# Patient Record
Sex: Female | Born: 1955 | Race: White | Hispanic: No | Marital: Single | Smoking: Never smoker
Health system: Southern US, Community
[De-identification: ages and names within clinical notes are randomized; demographics above are authoritative.]

## PROBLEM LIST (undated history)

## (undated) DIAGNOSIS — G2581 Restless legs syndrome: Secondary | ICD-10-CM

## (undated) DIAGNOSIS — IMO0001 Reserved for inherently not codable concepts without codable children: Secondary | ICD-10-CM

## (undated) DIAGNOSIS — F32A Depression, unspecified: Secondary | ICD-10-CM

## (undated) DIAGNOSIS — E669 Obesity, unspecified: Secondary | ICD-10-CM

## (undated) DIAGNOSIS — R251 Tremor, unspecified: Secondary | ICD-10-CM

## (undated) DIAGNOSIS — F419 Anxiety disorder, unspecified: Secondary | ICD-10-CM

## (undated) DIAGNOSIS — E7800 Pure hypercholesterolemia, unspecified: Secondary | ICD-10-CM

## (undated) DIAGNOSIS — F329 Major depressive disorder, single episode, unspecified: Secondary | ICD-10-CM

## (undated) DIAGNOSIS — D649 Anemia, unspecified: Secondary | ICD-10-CM

## (undated) DIAGNOSIS — M797 Fibromyalgia: Secondary | ICD-10-CM

## (undated) HISTORY — DX: Restless legs syndrome: G25.81

## (undated) HISTORY — DX: Depression, unspecified: F32.A

## (undated) HISTORY — DX: Major depressive disorder, single episode, unspecified: F32.9

## (undated) HISTORY — DX: Anemia, unspecified: D64.9

## (undated) HISTORY — DX: Tremor, unspecified: R25.1

## (undated) HISTORY — DX: Reserved for inherently not codable concepts without codable children: IMO0001

## (undated) HISTORY — DX: Fibromyalgia: M79.7

## (undated) HISTORY — PX: OTHER SURGICAL HISTORY: SHX169

## (undated) HISTORY — DX: Obesity, unspecified: E66.9

## (undated) HISTORY — DX: Pure hypercholesterolemia, unspecified: E78.00

## (undated) HISTORY — PX: OOPHORECTOMY: SHX86

## (undated) HISTORY — DX: Anxiety disorder, unspecified: F41.9

---

## 1974-04-03 HISTORY — PX: COSMETIC SURGERY: SHX468

## 1988-04-03 HISTORY — PX: OTHER SURGICAL HISTORY: SHX169

## 2007-05-08 ENCOUNTER — Encounter: Payer: Self-pay | Admitting: Family Medicine

## 2007-06-10 ENCOUNTER — Encounter: Payer: Self-pay | Admitting: Family Medicine

## 2008-05-15 ENCOUNTER — Ambulatory Visit: Payer: Self-pay | Admitting: Family Medicine

## 2008-05-15 DIAGNOSIS — F329 Major depressive disorder, single episode, unspecified: Secondary | ICD-10-CM

## 2008-05-15 DIAGNOSIS — F411 Generalized anxiety disorder: Secondary | ICD-10-CM | POA: Insufficient documentation

## 2008-05-15 DIAGNOSIS — D649 Anemia, unspecified: Secondary | ICD-10-CM

## 2008-05-15 DIAGNOSIS — F3289 Other specified depressive episodes: Secondary | ICD-10-CM | POA: Insufficient documentation

## 2008-05-21 ENCOUNTER — Encounter: Payer: Self-pay | Admitting: Family Medicine

## 2008-05-28 ENCOUNTER — Ambulatory Visit: Payer: Self-pay | Admitting: Family Medicine

## 2008-05-28 DIAGNOSIS — L299 Pruritus, unspecified: Secondary | ICD-10-CM | POA: Insufficient documentation

## 2008-05-28 DIAGNOSIS — R439 Unspecified disturbances of smell and taste: Secondary | ICD-10-CM | POA: Insufficient documentation

## 2008-05-28 DIAGNOSIS — R5383 Other fatigue: Secondary | ICD-10-CM

## 2008-05-28 DIAGNOSIS — R222 Localized swelling, mass and lump, trunk: Secondary | ICD-10-CM

## 2008-05-28 DIAGNOSIS — R5381 Other malaise: Secondary | ICD-10-CM | POA: Insufficient documentation

## 2008-06-26 ENCOUNTER — Ambulatory Visit: Payer: Self-pay | Admitting: Family Medicine

## 2008-06-26 DIAGNOSIS — L909 Atrophic disorder of skin, unspecified: Secondary | ICD-10-CM | POA: Insufficient documentation

## 2008-06-26 DIAGNOSIS — L821 Other seborrheic keratosis: Secondary | ICD-10-CM

## 2008-06-26 DIAGNOSIS — L919 Hypertrophic disorder of the skin, unspecified: Secondary | ICD-10-CM

## 2008-07-31 ENCOUNTER — Encounter: Payer: Self-pay | Admitting: Family Medicine

## 2008-07-31 ENCOUNTER — Ambulatory Visit: Payer: Self-pay | Admitting: Family Medicine

## 2008-07-31 DIAGNOSIS — L293 Anogenital pruritus, unspecified: Secondary | ICD-10-CM

## 2008-08-04 ENCOUNTER — Encounter: Payer: Self-pay | Admitting: Family Medicine

## 2008-08-13 ENCOUNTER — Telehealth: Payer: Self-pay | Admitting: *Deleted

## 2008-09-01 ENCOUNTER — Ambulatory Visit: Payer: Self-pay | Admitting: Family Medicine

## 2008-09-06 DIAGNOSIS — S30860A Insect bite (nonvenomous) of lower back and pelvis, initial encounter: Secondary | ICD-10-CM

## 2008-09-06 DIAGNOSIS — W57XXXA Bitten or stung by nonvenomous insect and other nonvenomous arthropods, initial encounter: Secondary | ICD-10-CM

## 2008-10-09 ENCOUNTER — Telehealth: Payer: Self-pay | Admitting: Family Medicine

## 2008-10-15 ENCOUNTER — Ambulatory Visit: Payer: Self-pay | Admitting: Family Medicine

## 2008-10-15 ENCOUNTER — Encounter: Payer: Self-pay | Admitting: Family Medicine

## 2008-10-15 DIAGNOSIS — L851 Acquired keratosis [keratoderma] palmaris et plantaris: Secondary | ICD-10-CM

## 2008-10-15 DIAGNOSIS — R634 Abnormal weight loss: Secondary | ICD-10-CM

## 2008-10-15 DIAGNOSIS — G4700 Insomnia, unspecified: Secondary | ICD-10-CM

## 2008-10-19 ENCOUNTER — Encounter: Payer: Self-pay | Admitting: Family Medicine

## 2009-02-09 ENCOUNTER — Telehealth: Payer: Self-pay | Admitting: *Deleted

## 2009-03-16 ENCOUNTER — Encounter: Payer: Self-pay | Admitting: Family Medicine

## 2009-09-22 ENCOUNTER — Telehealth: Payer: Self-pay | Admitting: Family Medicine

## 2009-10-08 ENCOUNTER — Encounter: Payer: Self-pay | Admitting: Family Medicine

## 2009-10-08 ENCOUNTER — Ambulatory Visit: Payer: Self-pay | Admitting: Family Medicine

## 2009-10-08 DIAGNOSIS — R635 Abnormal weight gain: Secondary | ICD-10-CM

## 2009-10-08 LAB — CONVERTED CEMR LAB
Basophils Relative: 1 % (ref 0–1)
Hemoglobin: 12.7 g/dL (ref 12.0–15.0)
Lymphocytes Relative: 27 % (ref 12–46)
MCHC: 33.6 g/dL (ref 30.0–36.0)
Monocytes Absolute: 0.5 10*3/uL (ref 0.1–1.0)
Monocytes Relative: 7 % (ref 3–12)
Neutro Abs: 4.1 10*3/uL (ref 1.7–7.7)
RBC: 3.86 M/uL — ABNORMAL LOW (ref 3.87–5.11)
TSH: 0.914 microintl units/mL (ref 0.350–4.500)

## 2009-10-13 ENCOUNTER — Encounter: Payer: Self-pay | Admitting: Family Medicine

## 2009-10-13 ENCOUNTER — Ambulatory Visit: Payer: Self-pay | Admitting: Family Medicine

## 2009-10-18 LAB — CONVERTED CEMR LAB
ALT: 11 units/L (ref 0–35)
CO2: 24 meq/L (ref 19–32)
Calcium: 9.3 mg/dL (ref 8.4–10.5)
Chloride: 104 meq/L (ref 96–112)
Cholesterol: 215 mg/dL — ABNORMAL HIGH (ref 0–200)
Sodium: 138 meq/L (ref 135–145)
Total Protein: 6.9 g/dL (ref 6.0–8.3)

## 2009-10-21 ENCOUNTER — Encounter: Payer: Self-pay | Admitting: Family Medicine

## 2009-11-02 ENCOUNTER — Encounter: Payer: Self-pay | Admitting: Family Medicine

## 2009-12-03 ENCOUNTER — Ambulatory Visit: Payer: Self-pay | Admitting: Family Medicine

## 2009-12-03 ENCOUNTER — Encounter: Payer: Self-pay | Admitting: Family Medicine

## 2009-12-03 DIAGNOSIS — N939 Abnormal uterine and vaginal bleeding, unspecified: Secondary | ICD-10-CM

## 2009-12-03 DIAGNOSIS — R197 Diarrhea, unspecified: Secondary | ICD-10-CM

## 2009-12-03 DIAGNOSIS — N926 Irregular menstruation, unspecified: Secondary | ICD-10-CM | POA: Insufficient documentation

## 2009-12-03 DIAGNOSIS — R32 Unspecified urinary incontinence: Secondary | ICD-10-CM | POA: Insufficient documentation

## 2009-12-08 ENCOUNTER — Encounter: Admission: RE | Admit: 2009-12-08 | Discharge: 2009-12-08 | Payer: Self-pay | Admitting: Family Medicine

## 2009-12-09 ENCOUNTER — Telehealth: Payer: Self-pay | Admitting: Family Medicine

## 2009-12-16 ENCOUNTER — Encounter: Payer: Self-pay | Admitting: Family Medicine

## 2009-12-16 ENCOUNTER — Ambulatory Visit: Payer: Self-pay | Admitting: Family Medicine

## 2009-12-20 ENCOUNTER — Encounter: Payer: Self-pay | Admitting: Family Medicine

## 2009-12-21 ENCOUNTER — Telehealth: Payer: Self-pay | Admitting: Family Medicine

## 2009-12-24 ENCOUNTER — Encounter: Payer: Self-pay | Admitting: *Deleted

## 2009-12-29 ENCOUNTER — Ambulatory Visit: Payer: Self-pay | Admitting: Obstetrics and Gynecology

## 2010-02-28 ENCOUNTER — Telehealth (INDEPENDENT_AMBULATORY_CARE_PROVIDER_SITE_OTHER): Payer: Self-pay | Admitting: *Deleted

## 2010-02-28 ENCOUNTER — Ambulatory Visit: Payer: Self-pay | Admitting: Family Medicine

## 2010-02-28 DIAGNOSIS — B029 Zoster without complications: Secondary | ICD-10-CM | POA: Insufficient documentation

## 2010-03-08 ENCOUNTER — Ambulatory Visit: Payer: Self-pay | Admitting: Family Medicine

## 2010-03-08 DIAGNOSIS — E785 Hyperlipidemia, unspecified: Secondary | ICD-10-CM

## 2010-03-09 ENCOUNTER — Telehealth: Payer: Self-pay | Admitting: Family Medicine

## 2010-04-12 ENCOUNTER — Telehealth: Payer: Self-pay | Admitting: *Deleted

## 2010-04-25 ENCOUNTER — Ambulatory Visit: Admit: 2010-04-25 | Payer: Self-pay

## 2010-04-26 ENCOUNTER — Ambulatory Visit
Admission: RE | Admit: 2010-04-26 | Payer: Self-pay | Source: Home / Self Care | Attending: Family Medicine | Admitting: Family Medicine

## 2010-04-27 ENCOUNTER — Ambulatory Visit: Admission: RE | Admit: 2010-04-27 | Discharge: 2010-04-27 | Payer: Self-pay | Source: Home / Self Care

## 2010-04-27 DIAGNOSIS — K116 Mucocele of salivary gland: Secondary | ICD-10-CM | POA: Insufficient documentation

## 2010-05-02 ENCOUNTER — Telehealth: Payer: Self-pay | Admitting: *Deleted

## 2010-05-03 NOTE — Miscellaneous (Signed)
Summary: update med list  Clinical Lists Changes  Medications: Changed medication from FLUOXETINE HCL 40 MG CAPS (FLUOXETINE HCL) take 1 and 1/2 tablets by mouth once daily to * FLUOXETINE HCL 60 MG CAPS (FLUOXETINE HCL) take 3  tablets by mouth once daily Changed medication from BUSPIRONE HCL 15 MG TABS (BUSPIRONE HCL) take 2 tablets two times a day to * BUSPIRONE HCL 30  MG TABS (BUSPIRONE HCL) take 1 tablet  two times a day

## 2010-05-03 NOTE — Assessment & Plan Note (Signed)
Summary: vaginal bleeding   Vital Signs:  Patient profile:   55 year old female Height:      63.75 inches Weight:      136.7 pounds BMI:     23.73 Temp:     99.2 degrees F oral Pulse rate:   63 / minute BP sitting:   137 / 68  (left arm) Cuff size:   regular  Vitals Entered By: Garen Grams LPN (December 03, 2009 2:08 PM) CC: vaginal bleeding x 5 days Is Patient Diabetic? No Pain Assessment Patient in pain? yes     Location: abd cramping   Primary Chrishauna Mee:  Jamie Brookes MD  CC:  vaginal bleeding x 5 days.  History of Present Illness: Vaginal bleeding: No periods in past  ~5 years since she has been taking hormone replacement therapy. No hot flashes or menopausal symptoms. Bright red blood in similar quantity to menstruation with some mild abdominal discomfort (denies cramps). Recently decreased dose of estrogen patch. Has hx of 2 dermoid cysts being removed, last pap normal this year. Not sexually active, no trauma, itching, discharge.  Has had 2 episodes of night-time urinary incontinence over the past few weeks. Some mild loss of urine with sneezing/coughing. Drinks caffeine frequently. No new medication changes.   Also c/o some loose stools for past month. Has chronic hx of small hard stools, now they are looser and she goes  ~6 times/day.   Habits & Providers  Alcohol-Tobacco-Diet     Tobacco Status: never     Tobacco Counseling: not indicated; no tobacco use  Exercise-Depression-Behavior     Does Patient Exercise: yes     Exercise Counseling: decrease exercise to 2 hrs daily     Type of exercise: walks dog, gym execising     Exercise (avg: min/session): 2-4 hours      Times/week: 7     Depression Counseling: further diagnostic testing and/or other treatment is indicated     Drug Use: no     Seat Belt Use: 100     Sun Exposure: occasionally  Allergies: No Known Drug Allergies  Past History:  Past Medical History: Last updated: 05/28/2008 Anemia-NOS as  a teenager Anxiety since childhood Depression since childhood (MDD pt is on disability due to the depression) 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 Olfactory intact.   Past Surgical History: Last updated: 05/15/2008 removal of 2 dermoid cysts - 1990 vagal nerve implant - 2006 plastic surger - nose and chin - 1975  Family History: Last updated: 05/28/2008 Mother - HTN Mother- diabetes - age 64 Father prostate cancer - age 86 Sister: adopted  Risk Factors: Smoking Status: never (12/03/2009)  Review of Systems  The patient denies anorexia, fever, weight loss, weight gain, syncope, dyspnea on exertion, peripheral edema, melena, hematochezia, muscle weakness, and unusual weight change.    Physical Exam  General:  Well-developed,well-nourished,in no acute distress; alert,appropriate and cooperative throughout examination Head:  normocephalic and atraumatic.   Eyes:  PERRLA, EOMI. Pink conjunctivae. Mouth:  good dentition and pharynx pink and moist.   Lungs:  Normal respiratory effort, chest expands symmetrically. Lungs are clear to auscultation, no crackles or wheezes. Heart:  Normal rate and regular rhythm. S1 and S2 normal without gallop, murmur, click, rub or other extra sounds. Abdomen:  soft, non-tender, and normal bowel sounds.   Pulses:  R and L dorsalis pedis pulses are full and equal bilaterally Extremities:  No clubbing, cyanosis, edema, or deformity noted with  normal full range of motion of all joints.   Neurologic:  alert & oriented X3, cranial nerves II-XII intact, strength normal in all extremities, and sensation intact to light touch.     Impression & Recommendations:  Problem # 1:  VAGINAL BLEEDING, ABNORMAL (ICD-626.9) Will see in [facility] for endometrial biopsy and schedule a transvaginal US. [facility] today. Pt will call if her bleeding increases in frequency or she develops any lightheadedness or weakness while she is  awaiting biopsy. She is hemodynamically stable today and not losing excessive blood. Causes include hormonal induction vs. atrophy vs. neoplastic.   Her updated medication list for this problem includes:    Medroxyprogesterone Acetate 2.5 Mg Tabs (Medroxyprogesterone acetate) .Marland Kitchen... Take 1 tablet once daily  Orders: [facility] ([id number]) Ultrasound (Ultrasound) [facility]- Est Level  3 (09811)  Problem # 2:  LOOSE STOOLS (ICD-787.91)  Unsure of subacute vs. chronic causes. Potentially medication induced vs. IBS. We discussed trying OTC fiber therapy to bulk up the stool. She agrees to schedule f/u with Dr. Clotilde Dieter if these symptoms do not improve.   Orders: [facility]- Est Level  3 (91478)  Problem # 3:  UNSPECIFIED URINARY INCONTINENCE (ICD-788.30) Likely component of stress incontinence or age-related changes. We discussed avoiding caffeine as behavior modification.   Complete Medication List: 1)  Fluoxetine Hcl 60 Mg Caps (fluoxetine Hcl)  .... Take 3  tablets by mouth once daily 2)  Vivelle-dot 0.025 Mg/24hr Pttw (Estradiol) .... Take 1 patch twice weekly. we will taper you off this medicine completely in 4-5 months. 3)  Lamotrigine 200 Mg Tabs (Lamotrigine) .... Take 1/2 tablet in the morning and 1 tablet in the evening 4)  Buspirone Hcl 30 Mg Tabs (buspirone Hcl)  .... Take 1 tablet  two times a day 5)  Medroxyprogesterone Acetate 2.5 Mg Tabs (Medroxyprogesterone acetate) .... Take 1 tablet once daily 6)  Fluconazole 150 Mg Tabs (Fluconazole) .... Take on pill by mouth. 7)  Doxycycline Hyclate 100 Mg Tabs (Doxycycline hyclate) .Marland Kitchen.. 1 tab 8)  Hydrocortisone 2.5 % Crea (Hydrocortisone) .... Apply to affected area when needed for itching. 9)  Abilify 5 Mg Tabs (Aripiprazole) .Marland Kitchen.. 1 at bedtime  rx by dr. Domenic Moras 10)  Trazodone Hcl 50 Mg Tabs (Trazodone hcl) .... Take 1/2 to 1 at bedtime  rx by dr. Domenic Moras  Patient Instructions: 1)  Nice to meet you. 2)  Please schedule appointment with [facility] for endometrial biopsy. 3)  Call clinic if your bleeding increases or you have any concerns [phone].

## 2010-05-03 NOTE — Progress Notes (Signed)
Summary: wi req  Phone Note Call from Patient Call back at Home Phone [phone]   Reason for Call: Talk to Nurse Summary of Call: req wi appt, rt side of face is swollen and eye is swollen shut Initial call taken by: Knox Royalty,  February 28, 2010 10:02 AM  Follow-up for Phone Call        Pt states she has a rash on her scalp and rt eye swelling that started approx 2 days ago.  States she thought the eyelid swelling was improving yesterday, but today has worsened and eye is swollen shut.  Lt eye started to swell also.  Denies any shortness of breath.  Has not eaten any different foods, does not recall coming into contact with poison ivy.  Does state that she feels safe to drive she can see out of left eye.  Scheduled pt for work in this morning.  Follow-up by: Rochele Pages RN,  February 28, 2010 11:02 AM

## 2010-05-03 NOTE — Assessment & Plan Note (Signed)
Summary: Weight gain   Vital Signs:  Patient profile:   55 year old female Height:      63.75 inches Weight:      139.8 pounds BMI:     31.22 Temp:     98.7 degrees F oral Pulse rate:   86 / minute BP sitting:   122 / 74  (left arm) Cuff size:   regular  Vitals Entered By: Gladstone Pih (October 08, 2009 3:57 PM) CC: weight Is Patient Diabetic? No Pain Assessment Patient in pain? no        Primary Care Provider:  Jamie Brookes MD  CC:  weight.  History of Present Illness: Weight Gain: She also has gained some weight since the last time I saw her. She had gotten down to 115 and is now back up to her normal weight of 139. She is not happy about the weight. Does not know why she lost the weight and then put it back on. Says she is not doing anything differently except taking new meds. I have not seen her in a while. She is taking care of some mental health issues at Dr. Mellody Dance (psychiatrist) office in [place]. He has started her on Abilify for depression and Trazedone for sleep. She continues to travel back and forth between [place] and [place] as she has homes in both places. She recovered nicely from the tick bite she had the last time I saw her.   Habits & Providers  Alcohol-Tobacco-Diet     Tobacco Status: never  Current Medications (verified): 1)  Fluoxetine Hcl 40 Mg Caps (Fluoxetine Hcl) .... Take 1 and 1/2 Tablets By Mouth Once Daily 2)  Vivelle-Dot 0.025 Mg/24hr Pttw (Estradiol) .... Take 1 Patch Twice Weekly. We Will Taper You Off This Medicine Completely in 4-5 Months. 3)  Lamotrigine 200 Mg Tabs (Lamotrigine) .... Take 1/2 Tablet in The Morning and 1 Tablet in The Evening 4)  Buspirone Hcl 15 Mg Tabs (Buspirone Hcl) .... Take 2 Tablets Two Times A Day 5)  Medroxyprogesterone Acetate 2.5 Mg Tabs (Medroxyprogesterone Acetate) .... Take 1 Tablet Once Daily 6)  Fluconazole 150 Mg Tabs (Fluconazole) .... Take On Pill By Mouth. 7)  Doxycycline Hyclate 100 Mg  Tabs (Doxycycline Hyclate) .Marland Kitchen.. 1 Tab 8)  Hydrocortisone 2.5 % Crea (Hydrocortisone) .... Apply To Affected Area When Needed For Itching. 9)  Abilify 5 Mg Tabs (Aripiprazole) .Marland Kitchen.. 1 At Bedtime  Rx By Dr. Domenic Moras 10)  Trazodone Hcl 50 Mg Tabs (Trazodone Hcl) .... Take 1/2 To 1 At Bedtime  Rx By Dr. Domenic Moras  Allergies (verified): No Known Drug Allergies  Review of Systems        vitals reviewed and pertinent negatives and positives seen in HPI   Physical Exam  General:  Well developed, well nourished, in no acute distress. Alert and oriented x 3.  Breasts:  healing scar on the left breast (LLQ) s/p skin infection months ago Lungs:  clear bilaterally to auscultation Heart:  regular rate and rhythm, S1, S2 within normal limits without murmurs or gallops; diastole is clear without pericardial sounds Abdomen:  Soft, non-tender, non-distended, free of hepatosplenomegaly or masses.  Psych:  depressed affect.     Impression & Recommendations:  Problem # 1:  WEIGHT GAIN (ICD-783.1) Assessment New Pt has put on almost 25 lbs since I saw her last. The only think that has changed are her meds. We will get some labs today to check for causes of weight gain.  Orders: [facility]SH-[facility] ([id number]) CBC w/Diff-[facility] (14782) [facility]- Est Level  3 (99213)Future Orders: Comp Met-[facility] ([id number]) ... 10/19/2010 Lipid-[facility] ([id number]) ... 10/19/2010  Complete Medication List: 1)  Fluoxetine Hcl 40 Mg Caps (Fluoxetine hcl) .... Take 1 and 1/2 tablets by mouth once daily 2)  Vivelle-dot 0.025 Mg/24hr Pttw (Estradiol) .... Take 1 patch twice weekly. we will taper you off this medicine completely in 4-5 months. 3)  Lamotrigine 200 Mg Tabs (Lamotrigine) .... Take 1/2 tablet in the morning and 1 tablet in the evening 4)  Buspirone Hcl 15 Mg Tabs (Buspirone hcl) .... Take 2 tablets two times a day 5)  Medroxyprogesterone Acetate 2.5 Mg Tabs (Medroxyprogesterone acetate) .... Take 1 tablet once daily 6)  Fluconazole 150  Mg Tabs (Fluconazole) .... Take on pill by mouth. 7)  Doxycycline Hyclate 100 Mg Tabs (Doxycycline hyclate) .Marland Kitchen.. 1 tab 8)  Hydrocortisone 2.5 % Crea (Hydrocortisone) .... Apply to affected area when needed for itching. 9)  Abilify 5 Mg Tabs (Aripiprazole) .Marland Kitchen.. 1 at bedtime  rx by dr. Domenic Moras 10)  Trazodone Hcl 50 Mg Tabs (Trazodone hcl) .... Take 1/2 to 1 at bedtime  rx by dr. Domenic Moras  Patient Instructions: 1)  It was good to see you today.  2)  i will send a copy of your lab work to you and Dr. Domenic Moras.  3)  We will give you info about how to get your mammogram done.  4)  Your next pap smear is due in April 2013 unless there are any other concerns.

## 2010-05-03 NOTE — Progress Notes (Signed)
Summary: Biopsy results negative  Phone Note Outgoing Call   Call placed by: Lloyd Huger MD,  December 21, 2009 11:51 AM Call placed to: Patient Summary of Call: Left message about normal biopsy results. Recommended she also keep her follow up with Gynecology.

## 2010-05-03 NOTE — Miscellaneous (Signed)
Summary: Medroxypro 2.5 mg refill  Clinical Lists Changes  Medications: Changed medication from MEDROXYPROGESTERONE ACETATE 2.5 MG TABS (MEDROXYPROGESTERONE ACETATE) take 1 tablet once daily to MEDROXYPROGESTERONE ACETATE 2.5 MG TABS (MEDROXYPROGESTERONE ACETATE) take 1 tablet once daily - Signed Rx of MEDROXYPROGESTERONE ACETATE 2.5 MG TABS (MEDROXYPROGESTERONE ACETATE) take 1 tablet once daily;  #31 x 3;  Signed;  Entered by: Jamie Brookes MD;  Authorized by: Jamie Brookes MD;  Method used: Electronically to [facility] [phone]*, [address], [place], [place]  [address], Ph: [phone], Fax: [phone]    Prescriptions: MEDROXYPROGESTERONE ACETATE 2.5 MG TABS (MEDROXYPROGESTERONE ACETATE) take 1 tablet once daily  #31 x 3   Entered and Authorized by:   Jamie Brookes MD   Signed by:   Jamie Brookes MD on 11/02/2009   Method used:   Electronically to        [facility] [phone]* (retail)       [address]       [place], [place]  [address]       Ph: [phone]       Fax: [phone]   RxID:   [id number]

## 2010-05-03 NOTE — Miscellaneous (Signed)
Summary: LM for pt-appt at [facility] 12/29/09 @ 2:30  Clinical Lists Changes LM for pt to call back so I can tell her @ appt at [facility].Golden Circle RN  December 24, 2009 12:23 PM gave her all info including phone # & directions...Marland KitchenMarland KitchenGolden Circle RN  December 28, 2009 11:32 AM

## 2010-05-03 NOTE — Progress Notes (Signed)
Summary: results  Phone Note Call from Patient Call back at [phone]   Caller: Patient Summary of Call: wants results of [place] from yesterday Initial call taken by: De Nurse,  December 09, 2009 11:35 AM  Follow-up for Phone Call        told her md has to read & will call her with results. she is anxious to hear what it says Follow-up by: Golden Circle RN,  December 09, 2009 11:38 AM  Additional Follow-up for Phone Call Additional follow up Details #1::        Called patient. I left her results on a message. I suggested she come in to get an endometrial biopsy by myself or Dr. Jennette Kettle in [facility] clinic.  Additional Follow-up by: Jamie Brookes MD,  December 10, 2009 2:07 PM

## 2010-05-03 NOTE — Miscellaneous (Signed)
Summary: Consent: Endometrial Biopsy  Consent: Endometrial Biopsy   Imported By: Knox Royalty 12/22/2009 08:56:31  _____________________________________________________________________  External Attachment:    Type:   Image     Comment:   External Document

## 2010-05-03 NOTE — Letter (Signed)
Summary: ENDO BX PATH Letter  [facility]  [address]   [place], [place] [address]   Phone: [phone]  Fax: [phone]    12/20/2009  Brandi Mora [address] [place], [place]  [address]  Dear [name],   The endometrial biopsy we did last week showed no worrisome cells       (normal). Given the findings on your pelvic ultrasound, I would recommend you keep the appointmet with the GYN clinic.  Please call me with questions.        Sincerely,   Denny Levy MD  Appended Document: ENDO BX PATH Letter mailed

## 2010-05-03 NOTE — Progress Notes (Signed)
Summary: Vivelle-Dot refilled with plans to taper off completel in 4-5 mo  Phone Note Refill Request Call back at Home Phone [phone] Message from:  Patient  Refills Requested: Medication #1:  VIVELLE-DOT 0.0375 MG/24HR PTTW use 1 patch twice weekly [facility]  Initial call taken by: De Nurse,  September 22, 2009 2:39 PM    New/Updated Medications: VIVELLE-DOT 0.025 MG/24HR PTTW (ESTRADIOL) take 1 patch twice weekly. We will taper you off this medicine completely in 4-5 months. Prescriptions: VIVELLE-DOT 0.025 MG/24HR PTTW (ESTRADIOL) take 1 patch twice weekly. We will taper you off this medicine completely in 4-5 months.  #8 x 4   Entered and Authorized by:   Jamie Brookes MD   Signed by:   Jamie Brookes MD on 09/24/2009   Method used:   Electronically to        [facility] [phone]* (retail)       [address]       [place], [place]  [address]       Ph: [phone]       Fax: [phone]   RxID:   [id number]

## 2010-05-03 NOTE — Assessment & Plan Note (Signed)
Summary: f/u shingles: can get Zostervax anytime, but 60 would be good...  pt agreed to video precepting.  Vital Signs:  Patient profile:   55 year old female Weight:      133.7 pounds Temp:     98.7 degrees F oral Pulse rate:   69 / minute Pulse rhythm:   regular BP sitting:   127 / 75  (left arm) Cuff size:   regular  Vitals Entered By: Loralee Pacas CMA (March 08, 2010 3:12 PM) CC: shingles   Primary Care Provider:  Jamie Brookes MD  CC:  shingles.  History of Present Illness: Shingles: Pt was diagnosed with shingles over the Rt upper face dermatome. She has been treated with Acyclovir and has 2 more days left. Advised to continue until the end of the course. She has no new lesions, she has complete crusting of the old lesions. Her pain is improved.  Current Medications (verified): 1)  Fluoxetine Hcl 60 Mg Caps (Fluoxetine Hcl) .... Take 3  Tablets By Mouth Once Daily 2)  Vivelle-Dot 0.025 Mg/24hr Pttw (Estradiol) .... Take 1 Patch Twice Weekly. We Will Taper You Off This Medicine Completely in 4-5 Months. 3)  Lamotrigine 200 Mg Tabs (Lamotrigine) .... Take 1/2 Tablet in The Morning and 1 Tablet in The Evening 4)  Buspirone Hcl 30  Mg Tabs (Buspirone Hcl) .... Take 1 Tablet  Two Times A Day 5)  Medroxyprogesterone Acetate 2.5 Mg Tabs (Medroxyprogesterone Acetate) .... Take 1 Tablet Once Daily 6)  Fluconazole 150 Mg Tabs (Fluconazole) .... Take On Pill By Mouth. 7)  Doxycycline Hyclate 100 Mg Tabs (Doxycycline Hyclate) .Marland Kitchen.. 1 Tab 8)  Hydrocortisone 2.5 % Crea (Hydrocortisone) .... Apply To Affected Area When Needed For Itching. 9)  Abilify 5 Mg Tabs (Aripiprazole) .Marland Kitchen.. 1 At Bedtime  Rx By Dr. Domenic Moras 10)  Trazodone Hcl 50 Mg Tabs (Trazodone Hcl) .... Take 1/2 To 1 At Bedtime  Rx By Dr. Domenic Moras 11)  Acyclovir 800 Mg Tabs (Acyclovir) .Marland Kitchen.. 1 Tab By Mouth 5 Times Per Day X 7 Days  Allergies (verified): No Known Drug Allergies  Review of Systems        vitals reviewed and  pertinent negatives and positives seen in HPI   Physical Exam  General:  Well-developed,well-nourished,in no acute distress; alert,appropriate and cooperative throughout examination Head:  Rt upper face has crusted lesions that are healing, the extend into her head.  Eyes:  No corneal or conjunctival inflammation noted. EOMI. Perrla. Funduscopic exam benign, without hemorrhages, exudates or papilledema. Vision grossly normal.   Impression & Recommendations:  Problem # 1:  HERPES ZOSTER (ICD-053.9) Assessment Improved Pt's face is healing. I suspect it will be much better by Christmas. She is going to see the opthalmologist again 10 days from her previous visit. She is using some eye drops he gave her. Advised to continue those drops and fininsh the Acyclovir course.   Orders: [facility]- Est Level  3 (16109)  Complete Medication List: 1)  Fluoxetine Hcl 60 Mg Caps (fluoxetine Hcl)  .... Take 3  tablets by mouth once daily 2)  Vivelle-dot 0.025 Mg/24hr Pttw (Estradiol) .... Take 1 patch twice weekly. we will taper you off this medicine completely in 4-5 months. 3)  Lamotrigine 200 Mg Tabs (Lamotrigine) .... Take 1/2 tablet in the morning and 1 tablet in the evening 4)  Buspirone Hcl 30 Mg Tabs (buspirone Hcl)  .... Take 1 tablet  two times a day 5)  Medroxyprogesterone Acetate 2.5 Mg Tabs (  Medroxyprogesterone acetate) .... Take 1 tablet once daily 6)  Fluconazole 150 Mg Tabs (Fluconazole) .... Take on pill by mouth. 7)  Doxycycline Hyclate 100 Mg Tabs (Doxycycline hyclate) .Marland Kitchen.. 1 tab 8)  Hydrocortisone 2.5 % Crea (Hydrocortisone) .... Apply to affected area when needed for itching. 9)  Abilify 5 Mg Tabs (Aripiprazole) .Marland Kitchen.. 1 at bedtime  rx by dr. Domenic Moras 10)  Trazodone Hcl 50 Mg Tabs (Trazodone hcl) .... Take 1/2 to 1 at bedtime  rx by dr. Domenic Moras 11)  Acyclovir 800 Mg Tabs (Acyclovir) .Marland Kitchen.. 1 tab by mouth 5 times per day x 7 days  Other Orders: Future Orders: Lipid-[facility] ([id number]) ...  03/09/2011  Patient Instructions: 1)  You eye is looking good.  2)  Your lesions have crusted over.  3)  I will look into the Zostervax to see when you can get it.  4)  Finish the Acyclovir and follow up with your opthomologist.    Orders Added: 1)  Lipid-[facility] [[id number]] 2)  [facility]- Est Level  3 [09811]

## 2010-05-03 NOTE — Assessment & Plan Note (Signed)
Summary: ENDO BIOPSY/KH   Vital Signs:  Patient profile:   55 year old female Height:      63.75 inches Weight:      134.1 pounds BMI:     23.28 Temp:     98.6 degrees F oral Pulse rate:   68 / minute BP sitting:   122 / 70  (left arm) Cuff size:   regular  Vitals Entered By: Garen Grams LPN (December 16, 2009 11:22 AM) CC: endometrial biopsy Is Patient Diabetic? No Pain Assessment Patient in pain? no        Primary Care Provider:  Jamie Brookes MD  CC:  endometrial biopsy.  History of Present Illness: Menopausal greater than 4-5  years without vaginal spotting --recently experienced vaginal leeding. has had PUS and FSH. N0 more episodes since seen by Dr Cristal Ford. Has been on HRT and about 2 months prior to bleeding episode she had reduced dosage strengh of HRT.   Habits & Providers  Alcohol-Tobacco-Diet     Tobacco Status: never     Tobacco Counseling: not indicated; no tobacco use  Current Medications (verified): 1)  Fluoxetine Hcl 60 Mg Caps (Fluoxetine Hcl) .... Take 3  Tablets By Mouth Once Daily 2)  Vivelle-Dot 0.025 Mg/24hr Pttw (Estradiol) .... Take 1 Patch Twice Weekly. We Will Taper You Off This Medicine Completely in 4-5 Months. 3)  Lamotrigine 200 Mg Tabs (Lamotrigine) .... Take 1/2 Tablet in The Morning and 1 Tablet in The Evening 4)  Buspirone Hcl 30  Mg Tabs (Buspirone Hcl) .... Take 1 Tablet  Two Times A Day 5)  Medroxyprogesterone Acetate 2.5 Mg Tabs (Medroxyprogesterone Acetate) .... Take 1 Tablet Once Daily 6)  Fluconazole 150 Mg Tabs (Fluconazole) .... Take On Pill By Mouth. 7)  Doxycycline Hyclate 100 Mg Tabs (Doxycycline Hyclate) .Marland Kitchen.. 1 Tab 8)  Hydrocortisone 2.5 % Crea (Hydrocortisone) .... Apply To Affected Area When Needed For Itching. 9)  Abilify 5 Mg Tabs (Aripiprazole) .Marland Kitchen.. 1 At Bedtime  Rx By Dr. Domenic Moras 10)  Trazodone Hcl 50 Mg Tabs (Trazodone Hcl) .... Take 1/2 To 1 At Bedtime  Rx By Dr. Domenic Moras  Allergies: No Known Drug Allergies  Past  History:  Past Medical History: Reviewed history from 05/28/2008 and no changes required. 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 Olfactory intact.   Past Surgical History: Reviewed history from 05/15/2008 and no changes required. removal of 2 dermoid cysts - 1990 vagal nerve implant - 2006 plastic surger - nose and chin - 1975  Physical Exam  General:  alert, well-developed, well-nourished, and well-hydrated.   Genitalia:  mild external vaginal atrophy. No vulvar lesions noted Bimanuala exam revealed no masses, niormal uterine size and position. Cervix apperared normal with stenotic os, small amount of old blood visible at os, Additional Exam:  Abnormal irregular mucosal thickening in the cervix and lower uterine segment with associated fluid/blood products. Tissue sampling is recommended to exclude neoplasm.  Patient given informed consent, signed copy in the chart. Placed in lithotomy position. Cervix viewed with sterile speculum. The portio of the cervix was cleansed with individual betadine swabs. The cervix was secured with a tenaculum placed in the anterior lip. The uterus was measured using sound. Endometrial sample was taken using GynoSampler pipelle.  ( three pipells used after 3 sounding attempts, 2 by Dr Denyse Amass and one by me--we got only a  small amount of dark muci9nous appearing material but it was cellular and I am fairly certain we were sampling in tehuterine cavity on the third sample). All equipment was removed. The patient tolerated the procedure well and was given post procedure instructions. we will contact her with pathology result    Impression & Recommendations:  Problem # 1:  VAGINAL BLEEDING, ABNORMAL (ICD-626.9)  Her updated medication list for this problem includes:    Medroxyprogesterone Acetate 2.5 Mg  Tabs (Medroxyprogesterone acetate) .Marland Kitchen... Take 1 tablet once daily  Orders: Gynecologic Referral (Gyn) [facility]- Est Level  3 (95621) Endometrial/Endocerv BX- [facility] (58100) endometrial biopsy today. reviewed [place] and her hx of HRT. I suspect the episode of vaginal bleeding was related to herdecrease in her HRT patch. Her stripe is 2 mm on pUS. Due to the fact that the [place] has some other abnormalities on it, I think optimal if she is seen by GYN and will make that appointment. Reviewed her [place] results with her. For now I will not d/c her HRT but certainly would be a consideration pending GYN eval and endometrial bx results. We were able to get only scant sample.  Complete Medication List: 1)  Fluoxetine Hcl 60 Mg Caps (fluoxetine Hcl)  .... Take 3  tablets by mouth once daily 2)  Vivelle-dot 0.025 Mg/24hr Pttw (Estradiol) .... Take 1 patch twice weekly. we will taper you off this medicine completely in 4-5 months. 3)  Lamotrigine 200 Mg Tabs (Lamotrigine) .... Take 1/2 tablet in the morning and 1 tablet in the evening 4)  Buspirone Hcl 30 Mg Tabs (buspirone Hcl)  .... Take 1 tablet  two times a day 5)  Medroxyprogesterone Acetate 2.5 Mg Tabs (Medroxyprogesterone acetate) .... Take 1 tablet once daily 6)  Fluconazole 150 Mg Tabs (Fluconazole) .... Take on pill by mouth. 7)  Doxycycline Hyclate 100 Mg Tabs (Doxycycline hyclate) .Marland Kitchen.. 1 tab 8)  Hydrocortisone 2.5 % Crea (Hydrocortisone) .... Apply to affected area when needed for itching. 9)  Abilify 5 Mg Tabs (Aripiprazole) .Marland Kitchen.. 1 at bedtime  rx by dr. Domenic Moras 10)  Trazodone Hcl 50 Mg Tabs (Trazodone hcl) .... Take 1/2 to 1 at bedtime  rx by dr. Domenic Moras

## 2010-05-03 NOTE — Assessment & Plan Note (Signed)
Summary: eyes matted together/ls   Vital Signs:  Patient profile:   55 year old female Height:      63.75 inches Weight:      135 pounds BMI:     23.44 Temp:     99.0 degrees F oral Pulse rate:   74 / minute BP sitting:   137 / 73  (right arm) Cuff size:   regular  Vitals Entered By: Tessie Fass CMA (February 28, 2010 11:53 AM) CC: right eye swollen. facial rash   Primary Care Provider:  Jamie Brookes MD  CC:  right eye swollen. facial rash.  History of Present Illness: 55 y/o F here for R sided facial rash and R eye edema since Friday.  Pt first noticed tingling sensation in scalp and hairline area, this started on Friday.  Then over the weekend she had a sensation that her hair was falling into her eye.  She noticed a reddish rash that worsened since Friday.  She noticed R eye edema on Sunday, but it worsened when she woke up this morning and was not able to open her R eye.  She is not sure if vision is impacted.  She denies eye pain or discharge.    Denies recent sick symptoms, travel, fever, chills, cough.  Sleep has not changed.  She is not taking prednisone or any immunosuppressant meds.  No history of cancer.    Current Medications (verified): 1)  Fluoxetine Hcl 60 Mg Caps (Fluoxetine Hcl) .... Take 3  Tablets By Mouth Once Daily 2)  Vivelle-Dot 0.025 Mg/24hr Pttw (Estradiol) .... Take 1 Patch Twice Weekly. We Will Taper You Off This Medicine Completely in 4-5 Months. 3)  Lamotrigine 200 Mg Tabs (Lamotrigine) .... Take 1/2 Tablet in The Morning and 1 Tablet in The Evening 4)  Buspirone Hcl 30  Mg Tabs (Buspirone Hcl) .... Take 1 Tablet  Two Times A Day 5)  Medroxyprogesterone Acetate 2.5 Mg Tabs (Medroxyprogesterone Acetate) .... Take 1 Tablet Once Daily 6)  Fluconazole 150 Mg Tabs (Fluconazole) .... Take On Pill By Mouth. 7)  Doxycycline Hyclate 100 Mg Tabs (Doxycycline Hyclate) .Marland Kitchen.. 1 Tab 8)  Hydrocortisone 2.5 % Crea (Hydrocortisone) .... Apply To Affected Area When  Needed For Itching. 9)  Abilify 5 Mg Tabs (Aripiprazole) .Marland Kitchen.. 1 At Bedtime  Rx By Dr. Domenic Moras 10)  Trazodone Hcl 50 Mg Tabs (Trazodone Hcl) .... Take 1/2 To 1 At Bedtime  Rx By Dr. 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 Olfactory intact.   Past Surgical History: Last updated: 05/15/2008 removal of 2 dermoid cysts - 1990 vagal nerve implant - 2006 plastic surger - nose and chin - 1975  Family History: Last updated: 05/28/2008 Mother - HTN Mother- diabetes - age 57 Father prostate cancer - age 51 Sister: adopted  Social History: Last updated: 10/15/2008 Pt is disabled because of her depression. Pt is currently living in her deceased parents house and taking care of 5 cats and 2 dogs. Her adopted sister (whom she does not get along with at all) is the executer of the will of her parents. They set up a trust fund for her but the sister is in charge of it and she precieves that her sister controls her through the threatenings about  the trust fund. She feels very out of control of her life and trapped by her sister. Pt has extensive psych history.  Risk Factors: Exercise: yes (12/03/2009)  Risk Factors: Smoking Status: never (12/16/2009)  Review of Systems       per hpi   Physical Exam  General:  Well-developed,well-nourished,in no acute distress; alert,appropriate and cooperative throughout examination. vitals reviewed.   Head:  Flat Red weeping rash begins on R side of forehead, at hairline, and continues to above eyebrow.  Surface area is 2.5inches x 4 inches.  Eyes:  Left eye vision grossly intact. R eyelid edem, R eye swollen shut. No appreciaable rash in periorbital area.  Nose:  No rash Cervical  Nodes:  No lymphadenopathy noted   Impression & Recommendations:  Problem # 1:  HERPES ZOSTER (ICD-053.9) Assessment New Symptoms started on Friday with scalp tingling.  Rash appeared later and has spread to upper eyebrow.  Rash on R side of forehead.  Likely Shingles.  Although symptoms may be present for > 72 hrs, will start Acyclovir, which I hope may decrease/shorten duration of symptoms.  I am concerned for R eye (eyelid swollen shut).  Will refer to Ophthalmology to be seen today.    Orders: Ophthalmology Referral (Ophthalmology) [facility]- Est Level  3 (30865)  Complete Medication List: 1)  Fluoxetine Hcl 60 Mg Caps (fluoxetine Hcl)  .... Take 3  tablets by mouth once daily 2)  Vivelle-dot 0.025 Mg/24hr Pttw (Estradiol) .... Take 1 patch twice weekly. we will taper you off this medicine completely in 4-5 months. 3)  Lamotrigine 200 Mg Tabs (Lamotrigine) .... Take 1/2 tablet in the morning and 1 tablet in the evening 4)  Buspirone Hcl 30 Mg Tabs (buspirone Hcl)  .... Take 1 tablet  two times a day 5)  Medroxyprogesterone Acetate 2.5 Mg Tabs (Medroxyprogesterone acetate) .... Take 1 tablet once daily 6)  Fluconazole 150 Mg Tabs (Fluconazole) .... Take on pill by mouth. 7)  Doxycycline Hyclate 100 Mg Tabs (Doxycycline hyclate) .Marland Kitchen.. 1 tab 8)  Hydrocortisone 2.5 % Crea (Hydrocortisone) .... Apply to affected area when needed for itching. 9)  Abilify 5 Mg Tabs (Aripiprazole) .Marland Kitchen.. 1 at bedtime  rx by dr. Domenic Moras 10)  Trazodone Hcl 50 Mg Tabs (Trazodone hcl) .... Take 1/2 to 1 at bedtime  rx by dr. Domenic Moras 11)  Acyclovir 800 Mg Tabs (Acyclovir) .Marland Kitchen.. 1 tab by mouth 5 times per day x 7 days  Patient Instructions: 1)  Please schedule a follow-up appointment as needed .  2)  You likely have the Shingles.  I've prescribed Acyclovir for you to take 5 times per day for 7 days.  This may help to decrease the symptoms. 3)  I will refer you to an ophthalmologist today.  It is important that you are seen  since the Shingles may affect your vision.  Prescriptions: ACYCLOVIR 800 MG TABS (ACYCLOVIR) 1 tab by mouth 5 times per day x 7 days  #35 x 0   Entered and Authorized by:   Angeline Slim MD   Signed by:   Angeline Slim MD on 02/28/2010   Method used:   Electronically to        [facility] [phone]* (retail)       [address]       [place], [place]  [address]       Ph: [phone]       Fax: [phone]   RxID:   [id number]    Orders  Added: 1)  Ophthalmology Referral [Ophthalmology] 2)  [facility]- Est Level  3 [[address]109]

## 2010-05-05 NOTE — Progress Notes (Signed)
Summary: medication/appt  Phone Note Outgoing Call   Call placed by: Loralee Pacas CMA,  April 12, 2010 1:50 PM Summary of Call: If she feels like she needs to go back on the ant-viral then she needs to have an appointment. Also, staying on her estrogen hormone pills put her at higher risk for breast cancer and I am not willing to continue them. If she would like to meet with an OB doc about this I am happy to set up that appointment but I think she needs to be seen first to discuss her options. Please let her know to make an appointment.   Follow-up for Phone Call        pt will comply and has an appt on 01.25 Follow-up by: Loralee Pacas CMA,  April 13, 2010 2:48 PM

## 2010-05-05 NOTE — Progress Notes (Signed)
Summary: Medroxyprogesterone refilled  Phone Note Refill Request   Refills Requested: Medication #1:  MEDROXYPROGESTERONE ACETATE 2.5 MG TABS take 1 tablet once daily [facility] [address]  Initial call taken by: Abundio Miu,  March 09, 2010 2:09 PM    Prescriptions: MEDROXYPROGESTERONE ACETATE 2.5 MG TABS (MEDROXYPROGESTERONE ACETATE) take 1 tablet once daily  #31 x 2   Entered and Authorized by:   Jamie Brookes MD   Signed by:   Jamie Brookes MD on 03/11/2010   Method used:   [facility]ically to        [facility]  [phone]* (retail)       [address] [place], [place]  [address]       Ph: [phone] or [phone]       Fax: [phone]   RxID:   [id number]   Appended Document: Medroxyprogesterone refilled called and lvm for pt to call back

## 2010-05-05 NOTE — Assessment & Plan Note (Signed)
Summary: f/u per Nadeem Romanoski also pt c/o "shingles"   Vital Signs:  Patient profile:   55 year old female Height:      63.75 inches Weight:      132.3 pounds BMI:     22.97 Temp:     98.6 degrees F oral Pulse rate:   66 / minute BP sitting:   132 / 74  (left arm) Cuff size:   regular  Vitals Entered By: Jimmy Footman, CMA (April 27, 2010 4:53 PM) CC: shingles, mouth lesions, hormones   Primary Care Provider:  Jamie Brookes MD  CC:  shingles, mouth lesions, and hormones.  History of Present Illness: 55 y/o F who comes in with several questions:  Shingles: she has some residual erytehma in some spots where her shingers lesions were. Explained that this was normal and that it should fade to look like normal skin tissue over the next 6 months. counseled to stay out of the sun or wear sunscreen.   Depression: Pt has siginificant h/o depression. She is currently disabled because of her depression. She has a vagal nerve stimlator put in by a doctor in [place] and managed by a Tommye Standard, MD psychiatrist in [place]. He is no longer going to see her and suggested finding a neurologist in the area that can manage a VNS.   Mouth lesions: Pt has tiny areas on both corners of her mouth on the inner mucosa that appear to have been bitten. She has been wearing her face mask for CPAP but thinks she had the lesions before she started wearing it.  The skin is not broken, it is only raised and easily irritated when she is chewing or talking.   Hormones: Pt has been on hormones for the last 5+ years and we have been tapering her down. She says that she has not had any symptoms and is doing well with the taper.  Habits & Providers  Alcohol-Tobacco-Diet     Tobacco Status: never  Current Medications (verified): 1)  Fluoxetine Hcl 60 Mg Caps (Fluoxetine Hcl) .... Take 3  Tablets By Mouth Once Daily 2)  Lamotrigine 200 Mg Tabs (Lamotrigine) .... Take 1/2 Tablet in The Morning and 1 Tablet in  The Evening 3)  Buspirone Hcl 30  Mg Tabs (Buspirone Hcl) .... Take 1 Tablet  Two Times A Day 4)  Fluconazole 150 Mg Tabs (Fluconazole) .... Take On Pill By Mouth. 5)  Doxycycline Hyclate 100 Mg Tabs (Doxycycline Hyclate) .Marland Kitchen.. 1 Tab 6)  Hydrocortisone 2.5 % Crea (Hydrocortisone) .... Apply To Affected Area When Needed For Itching. 7)  Abilify 5 Mg Tabs (Aripiprazole) .Marland Kitchen.. 1 At Bedtime  Rx By Dr. Domenic Moras 8)  Trazodone Hcl 50 Mg Tabs (Trazodone Hcl) .... Take 1/2 To 1 At Bedtime  Rx By Dr. Domenic Moras 9)  Acyclovir 800 Mg Tabs (Acyclovir) .Marland Kitchen.. 1 Tab By Mouth 5 Times Per Day X 7 Days  Allergies (verified): No Known Drug Allergies  Social History: Pt is disabled because of her depression. Has a vagal nuerse stimulater for her depression.  Pt is currently living in her deceased parents house and taking care of 5 cats and 2 dogs. Her adopted sister (whom she does not get along with at all) is the executer of the will of her parents. They set up a trust fund for her but the sister is in charge of it and she precieves that her sister controls her through the threatenings about the trust fund. She feels very out of  control of her life and trapped by her sister. Pt has extensive psych history.  Review of Systems        vitals reviewed and pertinent negatives and positives seen in HPI   Physical Exam  General:  Well-developed,well-nourished,in no acute distress; alert,appropriate and cooperative throughout examination Head:  some erythema on the Rt upper forehead where the patient had shingles.  Mouth:  tiny raised flesh colored papillea in mucosa on either side of the corners of her mouth. no ulcerations, no erythema   Impression & Recommendations:  Problem # 1:  HERPES ZOSTER (ICD-053.9) Assessment Improved Clearing up nicely. Use Vit E oil or Mederma if you think it's causing scaring, use sunscreen.   Orders: [facility]- Est  Level 4 ([address]109)  Problem # 2:  DEPRESSION (ICD-311) Assessment:  Deteriorated Pt has no one to monitor her VNS. will refer to Neurology for management.   Her updated medication list for this problem includes:    Trazodone Hcl 50 Mg Tabs (Trazodone hcl) .Marland Kitchen... Take 1/2 to 1 at bedtime  rx by dr. Domenic Moras  Orders: Neurology Referral (Neuro) [facility]- Est  Level 4 (60454)  Problem # 3:  MUCOCELE, SALIVARY GLAND (ICD-527.6) Assessment: New Pt has two small lesions on the mouth that may be tiny mucoceles. I suggested leaving them alone as they will clear on their own.   Orders: [facility]- Est  Level 4 (09811)  Problem # 4:  HORMONE REPLACEMENT THERAPY (ICD-V07.4) Assessment: Unchanged Pt is doing well. She is off the hormones now. She is not having any symptoms.   Orders: [facility]- Est  Level 4 (99214)  Complete Medication List: 1)  Fluoxetine Hcl 60 Mg Caps (fluoxetine Hcl)  .... Take 3  tablets by mouth once daily 2)  Lamotrigine 200 Mg Tabs (Lamotrigine) .... Take 1/2 tablet in the morning and 1 tablet in the evening 3)  Buspirone Hcl 30 Mg Tabs (buspirone Hcl)  .... Take 1 tablet  two times a day 4)  Fluconazole 150 Mg Tabs (Fluconazole) .... Take on pill by mouth. 5)  Doxycycline Hyclate 100 Mg Tabs (Doxycycline hyclate) .Marland Kitchen.. 1 tab 6)  Hydrocortisone 2.5 % Crea (Hydrocortisone) .... Apply to affected area when needed for itching. 7)  Abilify 5 Mg Tabs (Aripiprazole) .Marland Kitchen.. 1 at bedtime  rx by dr. Domenic Moras 8)  Trazodone Hcl 50 Mg Tabs (Trazodone hcl) .... Take 1/2 to 1 at bedtime  rx by dr. Domenic Moras 9)  Acyclovir 800 Mg Tabs (Acyclovir) .Marland Kitchen.. 1 tab by mouth 5 times per day x 7 days  Patient Instructions: 1)  It was nice to see you.  2)  We have stopped your hormone and you are doing well.  3)  Your cheeks will heal if you give them time.  4)  i have sent in a referral to Neurology.    Orders Added: 1)  Neurology Referral [Neuro] 2)  [facility]- Est  Level 4 [91478]

## 2010-05-11 NOTE — Progress Notes (Signed)
  Phone Note Outgoing Call Call back at [facility] Phone [phone]   Call placed by: Jimmy Footman, CMA,  May 02, 2010 4:56 PM Call placed to: Patient Summary of Call: LVM for pt to call back to inform of neurology referral appt. July 27, 2010 @ 10am w/ Dr. Anne Hahn. [facility]. [phone]  Follow-up for Phone Call        informed pt of appt time and date Follow-up by: Loralee Pacas CMA,  May 03, 2010 9:52 AM

## 2010-09-27 ENCOUNTER — Encounter: Payer: Self-pay | Admitting: Family Medicine

## 2011-06-15 ENCOUNTER — Ambulatory Visit (INDEPENDENT_AMBULATORY_CARE_PROVIDER_SITE_OTHER): Payer: Medicare Other | Admitting: Family Medicine

## 2011-06-15 ENCOUNTER — Ambulatory Visit: Payer: Medicare Other

## 2011-06-15 VITALS — BP 129/79 | HR 77 | Temp 98.6°F | Resp 16 | Ht 62.58 in | Wt 172.4 lb

## 2011-06-15 DIAGNOSIS — M549 Dorsalgia, unspecified: Secondary | ICD-10-CM

## 2011-06-15 DIAGNOSIS — M4150 Other secondary scoliosis, site unspecified: Secondary | ICD-10-CM

## 2011-06-15 DIAGNOSIS — M5430 Sciatica, unspecified side: Secondary | ICD-10-CM

## 2011-06-15 DIAGNOSIS — M4120 Other idiopathic scoliosis, site unspecified: Secondary | ICD-10-CM

## 2011-06-15 MED ORDER — METAXALONE 800 MG PO TABS: ORAL_TABLET | ORAL | Status: DC

## 2011-06-15 MED ORDER — NAPROXEN 500 MG PO TABS: ORAL_TABLET | ORAL | Status: DC

## 2011-06-15 NOTE — Progress Notes (Signed)
  Subjective:    Patient ID: Brandi Mora, female    DOB: 17-Nov-1955, 56 y.o.   MRN: [id number]  HPI 56 yo CF c/o pain R buttocks and goes all the way down her leg.  OTC meds not helping.  Worse with walking and standing.  NKI. Patient has never had back problems in general. No paresthesias.  No weakness.   Review of Systems  All other systems reviewed and are negative.       Objective:   Physical Exam  Constitutional: She is oriented to person, place, and time. She appears well-developed and well-nourished.  Cardiovascular: Normal rate, regular rhythm and normal heart sounds.   Pulmonary/Chest: Effort normal and breath sounds normal.  Musculoskeletal: Normal range of motion. She exhibits no edema.       Arms: Neurological: She is alert and oriented to person, place, and time.  Skin: Skin is warm and dry.  Abnormal thoracic and lumbar curve.  TTP over R SI joint.  Neg SLR B sitting.   [facility] reading (PRIMARY) by  Dr. Milus Glazier as scoliosis and moderate lordosis.       Assessment & Plan:  Gave printed images of normal healthy spine. Sciatica/sacroillitis-handouts of stretches and exercises.  Copy of xrays. Set up Dexa Scan, check vit D.  Pt. Will schedule CPE soon.

## 2011-06-20 LAB — VITAMIN D 1,25 DIHYDROXY
Vitamin D 1, 25 (OH)2 Total: 52 pg/mL (ref 18–72)
Vitamin D3 1, 25 (OH)2: 52 pg/mL

## 2011-07-10 ENCOUNTER — Ambulatory Visit (INDEPENDENT_AMBULATORY_CARE_PROVIDER_SITE_OTHER): Payer: Medicare Other | Admitting: Family Medicine

## 2011-07-10 VITALS — BP 133/74 | HR 76 | Temp 99.4°F | Resp 16 | Ht 63.5 in | Wt 168.4 lb

## 2011-07-10 DIAGNOSIS — M5430 Sciatica, unspecified side: Secondary | ICD-10-CM

## 2011-07-10 MED ORDER — PREDNISONE 20 MG PO TABS: ORAL_TABLET | ORAL | Status: AC

## 2011-07-10 NOTE — Progress Notes (Signed)
  Subjective:    Patient ID: Brandi Mora, female    DOB: 1956/03/04, 56 y.o.   MRN: [id number]  HPI 56 yo female with multiple medical problems including significant depression/psychiatric pathology here with back pain complaint.  Seen here 1 month ago with same and diagnosed with sciatica/sacroiliitis.  Given naprosyn and skelaxin.  Since that time: has not gotten better.  Meds didn't help at all.  No weakness.  Still right buttock and leg.  States has used prednisone in the past and it improved.  Plans to establish with [facility] [address] on 4/17.   Review of Systems Negative except as per HPI     Objective:   Physical Exam  Constitutional: Vital signs are normal. She appears well-developed and well-nourished. She is active.  Non-toxic appearance. She does not appear ill.  Cardiovascular: Normal rate, regular rhythm, normal heart sounds and normal pulses.   Pulmonary/Chest: Effort normal and breath sounds normal.  Musculoskeletal:       Lumbar back: She exhibits decreased range of motion, tenderness and spasm. She exhibits no bony tenderness, no swelling and no deformity.  Neurological: She is alert. She has normal strength. Gait normal.  Reflex Scores:      Patellar reflexes are 2+ on the right side and 2+ on the left side.      Achilles reflexes are 2+ on the right side and 2+ on the left side.  Tenderness right buttock.        Assessment & Plan:  Sciatica - discussed concern with prednisone and psychiatric illness but patient sure she has taken it without problems while on same meds in last few years.  Will try 6 day pred taper.  If no better, consider PT referral.

## 2011-07-11 ENCOUNTER — Encounter: Payer: Medicare Other | Admitting: Family Medicine

## 2011-08-17 ENCOUNTER — Encounter: Payer: Medicare Other | Admitting: Family Medicine

## 2011-09-15 ENCOUNTER — Encounter: Payer: Self-pay | Admitting: Family Medicine

## 2011-09-15 ENCOUNTER — Ambulatory Visit (INDEPENDENT_AMBULATORY_CARE_PROVIDER_SITE_OTHER): Payer: Medicare Other | Admitting: Family Medicine

## 2011-09-15 VITALS — BP 111/65 | HR 66 | Temp 98.8°F | Resp 16 | Ht 63.5 in | Wt 170.4 lb

## 2011-09-15 DIAGNOSIS — Z01419 Encounter for gynecological examination (general) (routine) without abnormal findings: Secondary | ICD-10-CM

## 2011-09-15 DIAGNOSIS — H538 Other visual disturbances: Secondary | ICD-10-CM

## 2011-09-15 DIAGNOSIS — Z124 Encounter for screening for malignant neoplasm of cervix: Secondary | ICD-10-CM

## 2011-09-15 DIAGNOSIS — F329 Major depressive disorder, single episode, unspecified: Secondary | ICD-10-CM

## 2011-09-15 DIAGNOSIS — Z23 Encounter for immunization: Secondary | ICD-10-CM

## 2011-09-15 DIAGNOSIS — M419 Scoliosis, unspecified: Secondary | ICD-10-CM

## 2011-09-15 DIAGNOSIS — E785 Hyperlipidemia, unspecified: Secondary | ICD-10-CM

## 2011-09-15 DIAGNOSIS — Z0000 Encounter for general adult medical examination without abnormal findings: Secondary | ICD-10-CM

## 2011-09-15 DIAGNOSIS — R69 Illness, unspecified: Secondary | ICD-10-CM

## 2011-09-15 DIAGNOSIS — Z139 Encounter for screening, unspecified: Secondary | ICD-10-CM

## 2011-09-15 LAB — COMPREHENSIVE METABOLIC PANEL
Alkaline Phosphatase: 103 U/L (ref 39–117)
Creat: 0.81 mg/dL (ref 0.50–1.10)
Glucose, Bld: 85 mg/dL (ref 70–99)
Sodium: 139 mEq/L (ref 135–145)
Total Bilirubin: 0.5 mg/dL (ref 0.3–1.2)
Total Protein: 7.1 g/dL (ref 6.0–8.3)

## 2011-09-15 LAB — IFOBT (OCCULT BLOOD): IFOBT: NEGATIVE

## 2011-09-15 LAB — POCT URINALYSIS DIPSTICK
Leukocytes, UA: NEGATIVE
Protein, UA: NEGATIVE
Spec Grav, UA: >=1.03
Urobilinogen, UA: 0.2

## 2011-09-15 LAB — LIPID PANEL
Cholesterol: 198 mg/dL (ref 0–200)
VLDL: 11 mg/dL (ref 0–40)

## 2011-09-18 LAB — PAP IG W/ RFLX HPV ASCU

## 2011-09-19 NOTE — Progress Notes (Signed)
Quick Note:  Please call pt and advise that the following labs are abnormal... All labs look really good but LDL ("bad") cholesterol is above normal. Good nutrition and regular exercise will help lower this number.  Copy to pt. ______

## 2011-09-19 NOTE — Progress Notes (Signed)
Quick Note:  PAP is normal/ negative.  Copy to pt. ______

## 2011-09-20 ENCOUNTER — Encounter: Payer: Self-pay | Admitting: *Deleted

## 2011-09-21 ENCOUNTER — Encounter: Payer: Self-pay | Admitting: Family Medicine

## 2011-09-21 NOTE — Progress Notes (Addendum)
Subjective:    Patient ID: Brandi Mora, female    DOB: 1955-10-24, 56 y.o.   MRN: [id number]  HPI  This 56 y.o. Cauc female is here for CPE/PAP/ Annual Medicare Wellness visit. She is disabled due  to Mental Health Diagnosis and has had difficulty finding a mental health provider to monitor her medications   and provide long term management. Her depression is of such severity that she underwent Vagal Nerve  Stimulation Implant in 2008 [place] She states this mental health disorder actually started in childhood.   Other PMHx is significant for Shingles in Nov 2011.   Last PAP: done but no date provided  Last MMG: 08/2011- Bi-Rads 0 (potential abnormality on right)  Last Colonoscopy: 2007 (normal)    Review of Systems  Constitutional: Positive for fatigue. Negative for fever, activity change, appetite change and unexpected weight change.  HENT: Positive for hearing loss, congestion, voice change and postnasal drip. Negative for rhinorrhea, sneezing, trouble swallowing, sinus pressure and tinnitus.   Eyes: Negative.   Respiratory: Positive for apnea. Negative for cough, chest tightness, shortness of breath and wheezing.   Cardiovascular: Negative.   Gastrointestinal: Positive for constipation and abdominal distention. Negative for nausea, abdominal pain and blood in stool.  Genitourinary: Positive for urgency and enuresis. Negative for dysuria, frequency, hematuria, vaginal bleeding, vaginal discharge, menstrual problem and pelvic pain.       Description c/w overflow incontinence  Musculoskeletal:       Scoliosis  Skin: Negative.   Neurological: Positive for headaches. Negative for dizziness, seizures, syncope, weakness, light-headedness and numbness.  Hematological: Negative.   Psychiatric/Behavioral: Positive for dysphoric mood, decreased concentration and agitation. Negative for suicidal ideas, confusion, disturbed wake/sleep cycle and self-injury. The patient is  nervous/anxious.        Objective:   Physical Exam  Nursing note and vitals reviewed. Constitutional: She is oriented to person, place, and time. She appears well-developed and well-nourished. No distress.  HENT:  Head: Normocephalic and atraumatic.  Right Ear: External ear normal.  Left Ear: External ear normal.  Nose: Nose normal.  Mouth/Throat: Oropharynx is clear and moist. No oropharyngeal exudate.       TMs dull with poor light reflex bilaterally  Eyes: Conjunctivae and EOM are normal. Pupils are equal, round, and reactive to light. No scleral icterus.  Neck: Normal range of motion. Neck supple. No thyromegaly present.  Cardiovascular: Normal rate, regular rhythm, normal heart sounds and intact distal pulses.  Exam reveals no gallop and no friction rub.   No murmur heard. Pulmonary/Chest: Effort normal and breath sounds normal. No respiratory distress. She has no wheezes. Right breast exhibits no inverted nipple, no mass, no nipple discharge, no skin change and no tenderness. Left breast exhibits no inverted nipple, no mass, no nipple discharge, no skin change and no tenderness. Breasts are symmetrical.       Dense breast tissue noted  Abdominal: Soft. Bowel sounds are normal. She exhibits no distension and no mass. There is no tenderness. There is no guarding.       No organomegaly  Genitourinary: Rectum normal and uterus normal. Rectal exam shows no external hemorrhoid, no fissure, no mass, no tenderness and anal tone normal. Guaiac negative stool. There is no rash, tenderness, lesion or injury on the right labia. There is no rash, tenderness, lesion or injury on the left labia. Uterus is not deviated and not enlarged. Cervix exhibits no motion tenderness and no discharge. Right adnexum displays no mass, no  tenderness and no fullness. Left adnexum displays no mass, no tenderness and no fullness. There is tenderness around the vagina. No erythema or bleeding around the vagina. No  vaginal discharge found.       Vaginal orifice very small (pt states never had intercourse)  Musculoskeletal: Normal range of motion. She exhibits no edema and no tenderness.       Spine: mild thoracic curvature noted  Lymphadenopathy:    She has no cervical adenopathy.       Right: No inguinal adenopathy present.       Left: No inguinal adenopathy present.  Neurological: She is alert and oriented to person, place, and time. She has normal reflexes. No cranial nerve deficit. She exhibits normal muscle tone. Coordination normal.  Skin: Skin is warm and dry. No rash noted. No pallor.  Psychiatric: Her speech is normal and behavior is normal. Judgment and thought content normal. Her affect is blunt. Her affect is not angry, not labile and not inappropriate. Cognition and memory are normal. She exhibits a depressed mood. She expresses no suicidal ideation. She expresses no suicidal plans.    Beck's Depression Inventory score= 9 (on current medications)      Assessment & Plan:   1. Routine general medical examination at a health care facility    2. Encounter for cervical Pap smear with pelvic exam  Pap IG w/ reflex to HPV when ASC-U  3. Taking multiple medications for chronic disease  Comprehensive metabolic panel  4. Depression, major - emphasized to pt the importance of having a Primary Mental Health Provider given the chronicity of her illness- she agrees Ambulatory referral to Psychiatry  Continue current medications but made clear to pt that I would not be authorizing RFs thus the Psych referral  5. Blurred vision  Ambulatory referral to Ophthalmology  6. Hyperlipidemia  Lipid panel  7. Need for prophylactic vaccination with combined diphtheria-tetanus-pertussis (DTP) vaccine  Tdap vaccine greater than or equal to 7yo IM  8. Scoliosis  Encouraged ROM and flexibility exercises

## 2011-11-13 ENCOUNTER — Ambulatory Visit (INDEPENDENT_AMBULATORY_CARE_PROVIDER_SITE_OTHER): Payer: Medicare Other | Admitting: Family Medicine

## 2011-11-13 ENCOUNTER — Ambulatory Visit: Payer: Medicare Other

## 2011-11-13 VITALS — BP 126/78 | HR 74 | Temp 98.9°F | Resp 16 | Ht 63.5 in | Wt 173.0 lb

## 2011-11-13 DIAGNOSIS — F341 Dysthymic disorder: Secondary | ICD-10-CM

## 2011-11-13 DIAGNOSIS — G252 Other specified forms of tremor: Secondary | ICD-10-CM

## 2011-11-13 DIAGNOSIS — F32A Depression, unspecified: Secondary | ICD-10-CM

## 2011-11-13 DIAGNOSIS — M25569 Pain in unspecified knee: Secondary | ICD-10-CM

## 2011-11-13 DIAGNOSIS — F329 Major depressive disorder, single episode, unspecified: Secondary | ICD-10-CM

## 2011-11-13 DIAGNOSIS — R259 Unspecified abnormal involuntary movements: Secondary | ICD-10-CM

## 2011-11-13 DIAGNOSIS — T148XXA Other injury of unspecified body region, initial encounter: Secondary | ICD-10-CM

## 2011-11-13 LAB — POCT CBC
Granulocyte percent: 63.4 % (ref 37–80)
HCT, POC: 45.6 % (ref 37.7–47.9)
Hemoglobin: 13.7 g/dL (ref 12.2–16.2)
Lymph, poc: 1.9 (ref 0.6–3.4)
MCH, POC: 30.6 pg (ref 27–31.2)
MCHC: 30 g/dL — AB (ref 31.8–35.4)
MCV: 101.7 fL — AB (ref 80–97)
MID (cbc): 0.4 (ref 0–0.9)
MPV: 8 fL (ref 0–99.8)
POC Granulocyte: 4.1 (ref 2–6.9)
POC LYMPH PERCENT: 30.2 %L (ref 10–50)
POC MID %: 6.4 %M (ref 0–12)
Platelet Count, POC: 350 10*3/uL (ref 142–424)
RBC: 4.48 M/uL (ref 4.04–5.48)
RDW, POC: 13 %
WBC: 6.4 10*3/uL (ref 4.6–10.2)

## 2011-11-13 MED ORDER — FLUOXETINE HCL 20 MG PO CAPS: 60.0000 mg | ORAL_CAPSULE | Freq: Every day | ORAL | Status: AC

## 2011-11-13 MED ORDER — BUSPIRONE HCL 30 MG PO TABS: 30.0000 mg | ORAL_TABLET | Freq: Two times a day (BID) | ORAL | Status: AC

## 2011-11-13 MED ORDER — MELOXICAM 7.5 MG PO TABS: 7.5000 mg | ORAL_TABLET | Freq: Two times a day (BID) | ORAL | Status: AC | PRN

## 2011-11-13 MED ORDER — ARIPIPRAZOLE 5 MG PO TABS: 10.0000 mg | ORAL_TABLET | Freq: Every day | ORAL | Status: DC

## 2011-11-13 MED ORDER — LAMOTRIGINE 200 MG PO TABS: 200.0000 mg | ORAL_TABLET | Freq: Every day | ORAL | Status: AC

## 2011-11-13 NOTE — Progress Notes (Signed)
[facility]:  Office Visit  Chief Complaint:  Chief Complaint  Patient presents with  . Medication Refill    needs renewal on Abilify  . Knee Injury    left knee injury 1 week ago, fell helping dog on wet grass  . Shaking    c/o "shaking " for a few weeks face and body /trembling    HPI: Brandi Mora is a 56 y.o. female who complains of : 1. Left knee pain s/p fall 1 week ago when fell on wet tile while trying to pick up her dog. 2. Was given 5 month refills on her Anxiety and depression medication but needs more refills and a referral to psychiatry 3. Tremors on face, hands, takes caffeine to stay awake x 1 month. + family h/o tremors. Denies any other sxs. She takes in increase amoutns of caffeine to keep awake due to sleepiness from meds she is on. She is asking about Aderrall/Provigil as an alternative to caffeine.   Past Medical History  Diagnosis Date  . Depression   . Anemia   . Anxiety    Past Surgical History  Procedure Date  . Cosmetic surgery 1976   History   Social History  . Marital Status: Single    Spouse Name: N/A    Number of Children: N/A  . Years of Education: N/A   Occupational History  . unemployed    Social History Main Topics  . Smoking status: Never Smoker   . Smokeless tobacco: None  . Alcohol Use: No  . Drug Use: No  . Sexually Active: None   Other Topics Concern  . None   Social History Narrative   Exercise - 3 times /wk for 3 hours doing water aerobics   Family History  Problem Relation Age of Onset  . Heart disease Mother     enlarged heart  . Hypertension Mother   . Cancer Father   . Diabetes Father     type 2  . Cancer Maternal Grandfather     prostate    Allergies  Allergen Reactions  . Elavil (Amitriptyline Hcl) Nausea And Vomiting, Nausea Only and Other (See Comments)    Dizziness;crying  . Wellbutrin (Bupropion Hcl) Other (See Comments)    Confusion   Prior to Admission medications     Medication Sig Start Date End Date Taking? Authorizing Provider  ARIPiprazole (ABILIFY) 5 MG tablet Take 10 mg by mouth at bedtime.    Yes Historical Provider, MD  Aspirin-Acetaminophen-Caffeine (EXCEDRIN PO) Take by mouth daily.   Yes Historical Provider, MD  busPIRone (BUSPAR) 30 MG tablet Take 30 mg by mouth 2 (two) times daily.     Yes Historical Provider, MD  FLUoxetine (PROZAC) 20 MG capsule Take 60 mg by mouth daily.   Yes Historical Provider, MD  lamoTRIgine (LAMICTAL) 200 MG tablet Take 200 mg by mouth daily. Take 1/2 tablet in the morning and 1 tablet in the evening    Yes Historical Provider, MD  Multiple Vitamin (MULTIVITAMIN) tablet Take 1 tablet by mouth daily.   Yes Historical Provider, MD  traZODone (DESYREL) 50 MG tablet Take 50 mg by mouth at bedtime. Take 1/2 to 1 at bedtime    Yes Historical Provider, MD  metaxalone (SKELAXIN) 800 MG tablet 1 tid X 10 days then prn spasm 06/15/11   Anders Simmonds, PA-C  naproxen (NAPROSYN) 500 MG tablet 1 tab 2 X daily X 10 days then prn pain 06/15/11  Anders Simmonds, PA-C     ROS: The patient denies fevers, chills, night sweats, unintentional weight loss, chest pain, palpitations, wheezing, dyspnea on exertion, nausea, vomiting, abdominal pain, dysuria, hematuria, melena, numbness, weakness, or tingling.   All other systems have been reviewed and were otherwise negative with the exception of those mentioned in the HPI and as above.    PHYSICAL EXAM: Filed Vitals:   11/13/11 1701  BP: 126/78  Pulse: 74  Temp: 98.9 F (37.2 C)  Resp: 16   Filed Vitals:   11/13/11 1701  Height: 5' 3.5" (1.613 m)  Weight: 173 lb (78.472 kg)   Body mass index is 30.16 kg/(m^2).  General: Alert, no acute distres HEENT:  Normocephalic, atraumatic, oropharynx patent. EOMI,PERRLA Cardiovascular:  Regular rate and rhythm, no rubs murmurs or gallops.  No Carotid bruits, radial pulse intact. No pedal edema.  Respiratory: Clear to auscultation  bilaterally.  No wheezes, rales, or rhonchi.  No cyanosis, no use of accessory musculature GI: No organomegaly, abdomen is soft and non-tender, positive bowel sounds.  No masses. Skin: No rashes. Neurologic: Facial musculature symmetric. Psychiatric: Patient is appropriate throughout our interaction. Lymphatic: No cervical lymphadenopathy Musculoskeletal: Gait intact. Knee-ROM nl, anterior knee tenderness along lower patellar, neg McMurray, neg lachman,  +DP, 2/2 DTR, Sensation and 5/5 strength intact   LABS: Results for orders placed in visit on 11/13/11  POCT CBC      Component Value Range   WBC 6.4  4.6 - 10.2 K/uL   Lymph, poc 1.9  0.6 - 3.4   POC LYMPH PERCENT 30.2  10 - 50 %L   MID (cbc) 0.4  0 - 0.9   POC MID % 6.4  0 - 12 %M   POC Granulocyte 4.1  2 - 6.9   Granulocyte percent 63.4  37 - 80 %G   RBC 4.48  4.04 - 5.48 M/uL   Hemoglobin 13.7  12.2 - 16.2 g/dL   HCT, POC 16.1  09.6 - 47.9 %   MCV 101.7 (*) 80 - 97 fL   MCH, POC 30.6  27 - 31.2 pg   MCHC 30.0 (*) 31.8 - 35.4 g/dL   RDW, POC 04.5     Platelet Count, POC 350  142 - 424 K/uL   MPV 8.0  0 - 99.8 fL     EKG/XRAY:   Primary read interpreted by Dr. Conley Rolls at [facility]. No fx or dislocation. No DJD + posterior osteophyte.    ASSESSMENT/PLAN: Encounter Diagnoses  Name Primary?  . Knee pain Yes  . Coarse tremors   . Sprain and strain   . Anxiety and depression    1. Left knee contusion. Take Tylenol prn. Rx Mobic and knee brace 2. Refill anxiety/depression meds for 3 months, refer to psychiatry. Patient asked to be referred to Phillip Heal MD at [facility] or whomever can get her in first.  3. Will get TSH, CBC and BMP for tremors. Advise to cut down caffeine. Monitor tremors. At this time, I do not make it a rule to provide Adderall to help with daytime somnolence from other meds. She can ask her psychiatrist for that if it continues. Consider sleep study if continues to be a problem.     Hamilton Capri  PHUONG, DO 11/13/2011 6:12 PM

## 2011-11-14 LAB — BASIC METABOLIC PANEL
BUN: 17 mg/dL (ref 6–23)
CO2: 28 mEq/L (ref 19–32)
Calcium: 10 mg/dL (ref 8.4–10.5)
Chloride: 103 mEq/L (ref 96–112)
Creat: 0.99 mg/dL (ref 0.50–1.10)
Glucose, Bld: 81 mg/dL (ref 70–99)
Potassium: 4.8 mEq/L (ref 3.5–5.3)
Sodium: 141 mEq/L (ref 135–145)

## 2011-11-15 LAB — TSH: TSH: 1.637 u[IU]/mL (ref 0.350–4.500)

## 2011-11-23 ENCOUNTER — Telehealth: Payer: Self-pay | Admitting: Radiology

## 2011-11-24 NOTE — Telephone Encounter (Signed)
Med encounter

## 2011-12-05 ENCOUNTER — Encounter: Payer: Self-pay | Admitting: Family Medicine

## 2013-03-10 ENCOUNTER — Other Ambulatory Visit: Payer: Self-pay | Admitting: Family Medicine

## 2013-03-10 ENCOUNTER — Other Ambulatory Visit (HOSPITAL_COMMUNITY)
Admission: RE | Admit: 2013-03-10 | Discharge: 2013-03-10 | Disposition: A | Discharge status: Home / Self Care | Payer: 59 | Source: Ambulatory Visit | Attending: Family Medicine | Admitting: Family Medicine

## 2013-03-10 DIAGNOSIS — Z1151 Encounter for screening for human papillomavirus (HPV): Secondary | ICD-10-CM | POA: Insufficient documentation

## 2013-03-10 DIAGNOSIS — Z124 Encounter for screening for malignant neoplasm of cervix: Secondary | ICD-10-CM | POA: Insufficient documentation

## 2013-03-12 ENCOUNTER — Other Ambulatory Visit: Payer: Self-pay

## 2013-03-12 DIAGNOSIS — Z1231 Encounter for screening mammogram for malignant neoplasm of breast: Secondary | ICD-10-CM

## 2013-04-14 ENCOUNTER — Other Ambulatory Visit: Payer: Self-pay

## 2013-04-14 ENCOUNTER — Ambulatory Visit
Admission: RE | Admit: 2013-04-14 | Discharge: 2013-04-14 | Disposition: A | Discharge status: Home / Self Care | Payer: PRIVATE HEALTH INSURANCE | Source: Ambulatory Visit

## 2013-04-14 DIAGNOSIS — Z1231 Encounter for screening mammogram for malignant neoplasm of breast: Secondary | ICD-10-CM

## 2013-09-08 ENCOUNTER — Encounter: Payer: Self-pay | Admitting: *Deleted

## 2013-09-09 ENCOUNTER — Encounter (INDEPENDENT_AMBULATORY_CARE_PROVIDER_SITE_OTHER): Payer: Self-pay

## 2013-09-09 ENCOUNTER — Ambulatory Visit (INDEPENDENT_AMBULATORY_CARE_PROVIDER_SITE_OTHER): Payer: PRIVATE HEALTH INSURANCE | Admitting: Neurology

## 2013-09-09 ENCOUNTER — Encounter: Payer: Self-pay | Admitting: Neurology

## 2013-09-09 VITALS — BP 116/71 | HR 71 | Ht 63.4 in | Wt 173.0 lb

## 2013-09-09 DIAGNOSIS — R259 Unspecified abnormal involuntary movements: Secondary | ICD-10-CM

## 2013-09-09 DIAGNOSIS — R251 Tremor, unspecified: Secondary | ICD-10-CM | POA: Insufficient documentation

## 2013-09-09 HISTORY — DX: Tremor, unspecified: R25.1

## 2013-09-09 NOTE — Patient Instructions (Signed)
Tremor  Tremor is a rhythmic, involuntary muscular contraction characterized by oscillations (to-and-fro movements) of a part of the body. The most common of all involuntary movements, tremor can affect various body parts such as the hands, head, facial structures, vocal cords, trunk, and legs; most tremors, however, occur in the hands. Tremor often accompanies neurological disorders associated with aging. Although the disorder is not life-threatening, it can be responsible for functional disability and social embarrassment.  TREATMENT   There are many types of tremor and several ways in which tremor is classified. The most common classification is by behavioral context or position. There are five categories of tremor within this classification: resting, postural, kinetic, task-specific, and psychogenic. Resting or static tremor occurs when the muscle is at rest, for example when the hands are lying on the lap. This type of tremor is often seen in patients with Parkinson's disease. Postural tremor occurs when a patient attempts to maintain posture, such as holding the hands outstretched. Postural tremors include physiological tremor, essential tremor, tremor with basal ganglia disease (also seen in patients with Parkinson's disease), cerebellar postural tremor, tremor with peripheral neuropathy, post-traumatic tremor, and alcoholic tremor. Kinetic or intention (action) tremor occurs during purposeful movement, for example during finger-to-nose testing. Task-specific tremor appears when performing goal-oriented tasks such as handwriting, speaking, or standing. This group consists of primary writing tremor, vocal tremor, and orthostatic tremor. Psychogenic tremor occurs in both older and younger patients. The key feature of this tremor is that it dramatically lessens or disappears when the patient is distracted.  PROGNOSIS  There are some treatment options available for tremor; the appropriate treatment depends on  accurate diagnosis of the cause. Some tremors respond to treatment of the underlying condition, for example in some cases of psychogenic tremor treating the patient's underlying mental problem may cause the tremor to disappear. Also, patients with tremor due to Parkinson's disease may be treated with Levodopa drug therapy. Symptomatic drug therapy is available for several other tremors as well. For those cases of tremor in which there is no effective drug treatment, physical measures such as teaching the patient to brace the affected limb during the tremor are sometimes useful. Surgical intervention such as thalamotomy or deep brain stimulation may be useful in certain cases.  Document Released: 03/10/2002 Document Revised: 06/12/2011 Document Reviewed: 03/20/2005  ExitCare® Patient Information ©2014 [employer].

## 2013-09-09 NOTE — Progress Notes (Signed)
Reason for visit: Tremor  Brandi Mora is a 58 y.o. female  History of present illness:  Ms. Claude MangesBrotherton is a 58 year old right-handed white female with a history of severe depression. The patient has been on Abilify for number of years, and she has had a vagal nerve stimulator placed for treatment of depression. The patient has begun noting some problems with tremor in both upper extremities and involving the head and neck and body in January 2015. The patient indicates that the tremors affect her handwriting and her ability to feed herself at times. The patient feels the tremor internally. She has not had significant balance issues, but she does have some mild gait instability without history of falls. The patient has had some troubles with occasional urinary incontinence. The patient feels weak all over, but she denies any numbness of the extremities. The patient has some excessive daytime drowsiness. She recently was placed on Cogentin at 0.5 mg daily. The patient is sent to this office for further evaluation.  Past Medical History  Diagnosis Date  . Depression   . Anemia   . Anxiety   . High cholesterol   . Fibromyalgia   . Tremor 09/09/2013    Past Surgical History  Procedure Laterality Date  . Cosmetic surgery  1976  . Oophorectomy    . Dermoid cyst removal  1990    Family History  Problem Relation Age of Onset  . Heart disease Mother     enlarged heart  . Hypertension Mother   . Cancer Mother     ovarian  . Cancer Father   . Diabetes Father     type 2  . Alzheimer's disease Father   . Cancer Maternal Grandfather     prostate   . Tremor Neg Hx     Social history:  reports that she has never smoked. She has never used smokeless tobacco. She reports that she drinks alcohol. She reports that she does not use illicit drugs.  Medications:  Current Outpatient Prescriptions on File Prior to Visit  Medication Sig Dispense Refill  . ARIPiprazole (ABILIFY) 5 MG  tablet Take 2 tablets (10 mg total) by mouth at bedtime.  60 tablet  2  . Aspirin-Acetaminophen-Caffeine (EXCEDRIN PO) Take by mouth daily.      . busPIRone (BUSPAR) 30 MG tablet Take 1 tablet (30 mg total) by mouth 2 (two) times daily.  60 tablet  3  . FLUoxetine (PROZAC) 20 MG capsule Take 3 capsules (60 mg total) by mouth daily.  90 capsule  3  . lamoTRIgine (LAMICTAL) 200 MG tablet Take 1 tablet (200 mg total) by mouth daily. Take 1/2 tablet in the morning and 1 tablet in the evening  45 tablet  3  . Multiple Vitamin (MULTIVITAMIN) tablet Take 1 tablet by mouth daily.      . traZODone (DESYREL) 50 MG tablet Take 50 mg by mouth at bedtime. Take 1/2 to 1 at bedtime       . metaxalone (SKELAXIN) 800 MG tablet 1 tid X 10 days then prn spasm  90 tablet  1   No current facility-administered medications on file prior to visit.      Allergies  Allergen Reactions  . Elavil [Amitriptyline Hcl] Nausea And Vomiting, Nausea Only and Other (See Comments)    Dizziness;crying  . Wellbutrin [Bupropion Hcl] Other (See Comments)    Confusion    ROS:  Out of a complete 14 system review of symptoms, the patient complains  only of the following symptoms, and all other reviewed systems are negative.  Weight loss, fatigue Snoring Easy bruising Joint pain, achy muscles Runny nose Weakness, difficulty swallowing, tremor Depression, anxiety, too much sleep Sleepiness, snoring, restless legs  Blood pressure 116/71, pulse 71, height 5' 3.4" (1.61 m), weight 173 lb (78.472 kg).  Physical Exam  General: The patient is alert and cooperative at the time of the examination.  Eyes: Pupils are equal, round, and reactive to light. Discs are flat bilaterally.  Neck: The neck is supple, no carotid bruits are noted.  Respiratory: The respiratory examination is clear.  Cardiovascular: The cardiovascular examination reveals a regular rate and rhythm, no obvious murmurs or rubs are noted.  Skin: Extremities  are without significant edema.  Neurologic Exam  Mental status: The patient is alert and oriented x 3 at the time of the examination. The patient has apparent normal recent and remote memory, with an apparently normal attention span and concentration ability.  Cranial nerves: Facial symmetry is present. There is good sensation of the face to pinprick and soft touch bilaterally. The strength of the facial muscles and the muscles to head turning and shoulder shrug are normal bilaterally. Speech is well enunciated, no aphasia or dysarthria is noted. Extraocular movements are full. Visual fields are full. The tongue is midline, and the patient has symmetric elevation of the soft palate. No obvious hearing deficits are noted.  Motor: The motor testing reveals 5 over 5 strength of all 4 extremities. Good symmetric motor tone is noted throughout.  Sensory: Sensory testing is intact to pinprick, soft touch, vibration sensation, and position sense on all 4 extremities. No evidence of extinction is noted.  Coordination: Cerebellar testing reveals good finger-nose-finger and heel-to-shin bilaterally. The patient has a mild intention tremor with finger-nose-finger.  Gait and station: Gait is normal. The patient is good arm swing bilaterally. Tandem gait is slightly unsteady. Romberg is negative. No drift is seen.  Reflexes: Deep tendon reflexes are symmetric and normal bilaterally. Toes are downgoing bilaterally.   Assessment/Plan:  1. Tremor  2. Depression  3. Vagal nerve stimulator placement  The patient will be sent for blood work to include a thyroid profile. She has recently been placed on Cogentin. If this seems to improve the tremor over the next 2 weeks, we may increase the dose. If not, we may try Topamax for the tremor. The patient does not have true parkinsonism at this time. She will followup in about 3 months. We will readjust the vagal nerve stimulator in the future.  Marlan Palau  MD 09/09/2013 8:07 PM  [facility] [address] [place], [place] [address]  Phone [phone] Fax [phone]

## 2013-09-10 LAB — TSH: TSH: 1.82 u[IU]/mL (ref 0.450–4.500)

## 2013-09-10 NOTE — Progress Notes (Signed)
Quick Note:  Called and shared unremarkable results thru VM message ______

## 2013-10-08 ENCOUNTER — Ambulatory Visit (HOSPITAL_BASED_OUTPATIENT_CLINIC_OR_DEPARTMENT_OTHER): Payer: PRIVATE HEALTH INSURANCE | Attending: Family Medicine | Admitting: Radiology

## 2013-10-08 VITALS — Ht 63.0 in | Wt 165.0 lb

## 2013-10-08 DIAGNOSIS — G4733 Obstructive sleep apnea (adult) (pediatric): Secondary | ICD-10-CM

## 2013-10-08 DIAGNOSIS — G4761 Periodic limb movement disorder: Secondary | ICD-10-CM | POA: Insufficient documentation

## 2013-10-08 DIAGNOSIS — G4710 Hypersomnia, unspecified: Secondary | ICD-10-CM | POA: Insufficient documentation

## 2013-10-08 DIAGNOSIS — G4730 Sleep apnea, unspecified: Principal | ICD-10-CM

## 2013-10-11 DIAGNOSIS — G4733 Obstructive sleep apnea (adult) (pediatric): Secondary | ICD-10-CM

## 2013-10-11 NOTE — Sleep Study (Signed)
   NAME: Brandi Mora DATE OF BIRTH:  06/26/1955 MEDICAL RECORD NUMBER [id number]  LOCATION: [facility]  PHYSICIAN: YOUNG,CLINTON D  DATE OF STUDY: 10/08/2013  SLEEP STUDY TYPE: Nocturnal Polysomnogram               REFERRING PHYSICIAN: Frederich ChickWebb, Carol D, MD  INDICATION FOR STUDY: Hypersomnia with sleep apnea  EPWORTH SLEEPINESS SCORE:   13/24 HEIGHT: 5\' 3"  (160 cm)  WEIGHT: 165 lb (74.844 kg)    Body mass index is 29.24 kg/(m^2).  NECK SIZE: 13 in.  MEDICATIONS: Charted for review  SLEEP ARCHITECTURE: Total sleep time 251.5 minutes with sleep efficiency 71.6%. Stage I was 8.5%, stage II 73%, stage III 18.5%, REM absent, sleep latency 17.5 minutes, awake after sleep onset 83 minutes, arousal index 11.9, bedtime medication: Trazodone, Lamictal, BuSpar. Sustain sleep onset was at about 12:15 AM.  RESPIRATORY DATA: Apnea hypopneas index (AHI) 2.1 per hour. 9 total events scored including 5 obstructive apneas and 4 hypopneas. Non-positional events. There were not enough events for split protocol CPAP titration.  OXYGEN DATA: Moderately loud snoring with oxygen desaturation to a nadir of 89% and a mean saturation through the study of 93.7% on room air.  CARDIAC DATA: Sinus rhythm  MOVEMENT/PARASOMNIA: 133 total limb jerks counted of which 27 were associated with arousal or awakenings for periodic limb movement with arousal index of 6.4 per hour. Bathroom x1  IMPRESSION/ RECOMMENDATION:   1) Sleep architecture was significant for sustained sleep onset at about 12:15 AM with bedtime medication trazodone, Lamictal, BuSpar 2) Occasional respiratory events with sleep disturbance, within normal limits. AHI 2.1 per hour (the normal range for adults as an AHI from 0-5 events per hour). Moderately loud snoring with oxygen desaturation to a nadir of 89% and a mean saturation through the study of 93.7% on room air.  3) Periodic limb movement with arousal. A total of 133 limb  jerks were counted of which 27 were associated with arousal or awakenings for a periodic limb movement with arousal index of 6.4 events per hour. If sleep disturbance by limb jerks is significant in the home environment, then a therapeutic trial of specific medication such as Requip or Mirapex might be considered.  Signed Jetty Duhamellinton Young M.D. Waymon BudgeYOUNG,CLINTON D Diplomate, American Board of Sleep Medicine  ELECTRONICALLY SIGNED ON:  10/11/2013, 12:33 PM [facility] PH: [phone]   FX: [phone] ACCREDITED BY THE AMERICAN ACADEMY OF SLEEP MEDICINE

## 2013-10-13 ENCOUNTER — Other Ambulatory Visit: Payer: Self-pay | Admitting: Pain Medicine

## 2013-10-13 DIAGNOSIS — M5136 Other intervertebral disc degeneration, lumbar region: Secondary | ICD-10-CM

## 2013-10-13 DIAGNOSIS — M542 Cervicalgia: Secondary | ICD-10-CM

## 2013-10-14 ENCOUNTER — Ambulatory Visit
Admission: RE | Admit: 2013-10-14 | Discharge: 2013-10-14 | Disposition: A | Discharge status: Home / Self Care | Payer: PRIVATE HEALTH INSURANCE | Source: Ambulatory Visit | Attending: Pain Medicine | Admitting: Pain Medicine

## 2013-10-14 ENCOUNTER — Ambulatory Visit
Admission: RE | Admit: 2013-10-14 | Discharge: 2013-10-14 | Disposition: A | Discharge status: Home / Self Care | Payer: 59 | Source: Ambulatory Visit | Attending: Pain Medicine | Admitting: Pain Medicine

## 2013-10-14 DIAGNOSIS — M5136 Other intervertebral disc degeneration, lumbar region: Secondary | ICD-10-CM

## 2013-10-14 DIAGNOSIS — M542 Cervicalgia: Secondary | ICD-10-CM

## 2013-12-09 ENCOUNTER — Encounter: Payer: Self-pay | Admitting: Neurology

## 2013-12-09 ENCOUNTER — Ambulatory Visit (INDEPENDENT_AMBULATORY_CARE_PROVIDER_SITE_OTHER): Payer: PRIVATE HEALTH INSURANCE | Admitting: Neurology

## 2013-12-09 ENCOUNTER — Telehealth: Payer: Self-pay | Admitting: *Deleted

## 2013-12-09 VITALS — BP 120/76 | HR 76 | Ht 63.5 in | Wt 150.0 lb

## 2013-12-09 DIAGNOSIS — G2581 Restless legs syndrome: Secondary | ICD-10-CM

## 2013-12-09 DIAGNOSIS — R251 Tremor, unspecified: Secondary | ICD-10-CM

## 2013-12-09 DIAGNOSIS — R259 Unspecified abnormal involuntary movements: Secondary | ICD-10-CM

## 2013-12-09 HISTORY — DX: Restless legs syndrome: G25.81

## 2013-12-09 NOTE — Progress Notes (Signed)
Reason for visit: Tremor  Brandi Mora is an 58 y.o. female  History of present illness:  Brandi Mora is a 58 year old right-handed white female with a history of severe depression. The patient has had a vagal nerve stimulator placed, and she has been on various medications for her depression. The patient has previously reported tremor, but she believes that this has improved over time. The patient has come off of Abilify which may have helped. She is having some problems with restless leg syndrome, and she is on Requip currently. She has reported some tingling sensations into the legs bilaterally right greater than left over the last 1 week. She has not had any weakness or legs. She denies any problems controlling the bowels or the bladder. She reports some significant issues with motion sickness, but she also has a history of migraine headache. She returns for an evaluation.  Past Medical History  Diagnosis Date  . Depression   . Anemia   . Anxiety   . High cholesterol   . Fibromyalgia   . Tremor 09/09/2013  . Mildly obese   . Restless legs syndrome (RLS) 12/09/2013    Past Surgical History  Procedure Laterality Date  . Cosmetic surgery  1976  . Oophorectomy    . Dermoid cyst removal  1990  . Vagal nerve stimulator placement Left     Family History  Problem Relation Age of Onset  . Heart disease Mother     enlarged heart  . Hypertension Mother   . Cancer Mother     ovarian  . Cancer Father   . Diabetes Father     type 2  . Alzheimer's disease Father   . Cancer Maternal Grandfather     prostate   . Tremor Neg Hx     Social history:  reports that she has never smoked. She has never used smokeless tobacco. She reports that she drinks alcohol. She reports that she does not use illicit drugs.    Allergies  Allergen Reactions  . Elavil [Amitriptyline Hcl] Nausea And Vomiting, Nausea Only and Other (See Comments)    Dizziness;crying  . Wellbutrin [Bupropion  Hcl] Other (See Comments)    Confusion  . Gabapentin     dizzy  . Lyrica [Pregabalin]     Dizzy     Medications:  Current Outpatient Prescriptions on File Prior to Visit  Medication Sig Dispense Refill  . Aspirin-Acetaminophen-Caffeine (EXCEDRIN PO) Take by mouth daily.      . busPIRone (BUSPAR) 30 MG tablet Take 1 tablet (30 mg total) by mouth 2 (two) times daily.  60 tablet  3  . FLUoxetine (PROZAC) 20 MG capsule Take 3 capsules (60 mg total) by mouth daily.  90 capsule  3  . lamoTRIgine (LAMICTAL) 200 MG tablet Take 1 tablet (200 mg total) by mouth daily. Take 1/2 tablet in the morning and 1 tablet in the evening  45 tablet  3  . Multiple Vitamin (MULTIVITAMIN) tablet Take 1 tablet by mouth daily.      . traZODone (DESYREL) 50 MG tablet Take 50 mg by mouth at bedtime. Take 1/2 to 1 at bedtime        No current facility-administered medications on file prior to visit.    ROS:  Out of a complete 14 system review of symptoms, the patient complains only of the following symptoms, and all other reviewed systems are negative.  Appetite change, fatigue Runny nose Stress incontinence of bladder Restless legs, daytime  sleepiness, snoring Joint pain, achy muscles Bruising easily Tremors Depression, anxiety  Blood pressure 120/76, pulse 76, height 5' 3.5" (1.613 m), weight 150 lb (68.04 kg).  Physical Exam  General: The patient is alert and cooperative at the time of the examination. The patient is minimally obese.  Skin: No significant peripheral edema is noted.   Neurologic Exam  Mental status: The patient is oriented x 3.  Cranial nerves: Facial symmetry is present. Speech is normal, no aphasia or dysarthria is noted. Extraocular movements are full. Visual fields are full.  Motor: The patient has good strength in all 4 extremities.  Sensory examination: Soft touch sensation is symmetric on the face, arms, and legs.  Coordination: The patient has good  finger-nose-finger and heel-to-shin bilaterally.  Gait and station: The patient has a normal gait. Tandem gait is normal. Romberg is negative. No drift is seen.  Reflexes: Deep tendon reflexes are symmetric.     VAGAL NERVE STIMULATOR SETTINGS:  Output current: 1.00 milliamps  Signal frequency: 20 Hz  Pulse width: 130 microseconds  On time: 30 seconds  Off time: 1.8 minutes  Magnet current: 0.0 milliamps  Magnet on time: 60 seconds  Pulse width: 500 microseconds  Activations: 0   Output current: 1.00 milliamps  Lead impedance: OK  IFI: No  The VNS unit was interrogated, and the settings were not changed as above. The patient appeared to tolerate the changes well.  Marlan Palau MD 12/09/2013 4:11 PM  [facility] [address] [place], [place] [address]  Phone [phone] Fax [phone]    Assessment/Plan:  1. History of tremor  2. Restless leg syndrome  3. Depression  4. Vagal nerve stimulator placement  The patient wishes to have a magnet for her vagal nerve stimulator. I will try to get one from the manufacturer. The patient has had some sensory complaints of both legs recently within the last week. If this persists, the patient will contact me in the next 2 or 3 weeks, we may consider EMG and nerve conduction studies. Otherwise, the patient followup in 6-8 months.  Marlan Palau MD 12/09/2013 4:11 PM  [facility] [address] [place], [place] [address]  Phone [phone] Fax [phone]

## 2013-12-09 NOTE — Telephone Encounter (Signed)
Megan with [employer] will be out of the office until 12/15/13. I did leave a message for her to send a magnet for this patients stimulator.

## 2013-12-09 NOTE — Patient Instructions (Signed)
Restless Legs Syndrome Restless legs syndrome is a movement disorder. It may also be called a sensorimotor disorder.  CAUSES  No one knows what specifically causes restless legs syndrome, but it tends to run in families. It is also more common in people with low iron, in pregnancy, in people who need dialysis, and those with nerve damage (neuropathy).Some medications may make restless legs syndrome worse.Those medications include drugs to treat high blood pressure, some heart conditions, nausea, colds, allergies, and depression. SYMPTOMS Symptoms include uncomfortable sensations in the legs. These leg sensations are worse during periods of inactivity or rest. They are also worse while sitting or lying down. Individuals that have the disorder describe sensations in the legs that feel like:  Pulling.  Drawing.  Crawling.  Worming.  Boring.  Tingling.  Pins and needles.  Prickling.  Pain. The sensations are usually accompanied by an overwhelming urge to move the legs. Sudden muscle jerks may also occur. Movement provides temporary relief from the discomfort. In rare cases, the arms may also be affected. Symptoms may interfere with going to sleep (sleep onset insomnia). Restless legs syndrome may also be related to periodic limb movement disorder (PLMD). PLMD is another more common motor disorder. It also causes interrupted sleep. The symptoms from PLMD usually occur most often when you are awake. TREATMENT  Treatment for restless legs syndrome is symptomatic. This means that the symptoms are treated.   Massage and cold compresses may provide temporary relief.  Walk, stretch, or take a cold or hot bath.  Get regular exercise and a good night's sleep.  Avoid caffeine, alcohol, nicotine, and medications that can make it worse.  Do activities that provide mental stimulation like discussions, needlework, and video games. These may be helpful if you are not able to walk or stretch. Some  medications are effective in relieving the symptoms. However, many of these medications have side effects. Ask your caregiver about medications that may help your symptoms. Correcting iron deficiency may improve symptoms for some patients. Document Released: 03/10/2002 Document Revised: 08/04/2013 Document Reviewed: 06/16/2010 Exit[employer] Patient Information 2015 [employer]. This information is not intended to replace advice given to you by your health care provider. Make sure you discuss any questions you have with your health care provider.  

## 2013-12-10 ENCOUNTER — Ambulatory Visit: Payer: PRIVATE HEALTH INSURANCE | Admitting: Neurology

## 2013-12-12 ENCOUNTER — Ambulatory Visit: Payer: PRIVATE HEALTH INSURANCE | Admitting: Neurology

## 2013-12-15 ENCOUNTER — Telehealth: Payer: Self-pay | Admitting: *Deleted

## 2013-12-15 NOTE — Telephone Encounter (Signed)
Spoke with Aundra Millet in regards to ordering magnets for Brandi Mora. She will place the order and come by office on Wednesday.

## 2013-12-17 ENCOUNTER — Telehealth: Payer: Self-pay | Admitting: Neurology

## 2013-12-17 ENCOUNTER — Telehealth: Payer: Self-pay | Admitting: *Deleted

## 2013-12-17 NOTE — Telephone Encounter (Signed)
Patient was called and a message left in regards to magnets for VNS stimulator being ready for pick-up. She has since called back requesting an earlier appointment. She was last seen 12/09/13. Patient instructions below:   The patient wishes to have a magnet for her vagal nerve stimulator. I will try to get one from the manufacturer. The patient has had some sensory complaints of both legs recently within the last week. If this persists, the patient will contact me in the next 2 or 3 weeks, we may consider EMG and nerve conduction studies. Otherwise, the patient followup in 6-8 months.

## 2013-12-17 NOTE — Telephone Encounter (Signed)
I called patient. The patient wishes to have a sooner appointment. She has had some sensory complaints of both legs, I'll try to get a revisit sometime in the next several days or weeks.

## 2013-12-17 NOTE — Telephone Encounter (Signed)
Called to inform patient that magnets for vns stimulater are here for pick-up.

## 2013-12-17 NOTE — Telephone Encounter (Signed)
Patient calling to request sooner appointment with Dr. Anne Hahn due to her pain, please return call and advise.

## 2014-01-02 ENCOUNTER — Encounter: Payer: Self-pay | Admitting: Neurology

## 2014-01-02 ENCOUNTER — Ambulatory Visit (INDEPENDENT_AMBULATORY_CARE_PROVIDER_SITE_OTHER): Payer: PRIVATE HEALTH INSURANCE | Admitting: Neurology

## 2014-01-02 VITALS — BP 131/77 | HR 77 | Ht 63.5 in | Wt 151.0 lb

## 2014-01-02 DIAGNOSIS — IMO0001 Reserved for inherently not codable concepts without codable children: Secondary | ICD-10-CM

## 2014-01-02 DIAGNOSIS — M609 Myositis, unspecified: Secondary | ICD-10-CM

## 2014-01-02 DIAGNOSIS — M791 Myalgia: Secondary | ICD-10-CM

## 2014-01-02 DIAGNOSIS — R251 Tremor, unspecified: Secondary | ICD-10-CM

## 2014-01-02 DIAGNOSIS — G2581 Restless legs syndrome: Secondary | ICD-10-CM

## 2014-01-02 HISTORY — DX: Reserved for inherently not codable concepts without codable children: IMO0001

## 2014-01-02 MED ORDER — MELOXICAM 15 MG PO TABS: 15.0000 mg | ORAL_TABLET | Freq: Every day | ORAL | Status: DC

## 2014-01-02 NOTE — Patient Instructions (Signed)

## 2014-01-02 NOTE — Progress Notes (Signed)
Reason for visit: Myalgias  Brandi Mora is an 58 y.o. female  History of present illness:  Brandi Mora is a 58 year old right-handed white female with a history of severe anxiety and depression. The patient reports onset of some myalgias that have started in January 2015. The patient indicates that the pain was more generalized initially, but now it affects mainly her lower extremities. Ultram seemed to help initially, but it has become less effective over time. She reports discomfort mainly in the muscles of the thighs and the calves of the legs. The patient denies muscle cramps. She denies pain in the joints of the legs. She does have some discomfort in the arms at this time, she reports no significant problems with the neck, shoulders, or low back or hips with discomfort. The patient was given a trial on gabapentin and Lyrica, but she could not tolerate these medications. The patient does not report any medications were added or taken away around the time of onset of discomfort. The patient has not had any balance problems or difficulty controlling the bowels or the bladder. She reports the pain is a dull achy sensation. She denies any weakness. She indicates that her primary care physician has checked some blood work that she believes included a lupus test, and was told this was unremarkable. She comes to this office for an evaluation.  Past Medical History  Diagnosis Date  . Depression   . Anemia   . Anxiety   . High cholesterol   . Fibromyalgia   . Tremor 09/09/2013  . Mildly obese   . Restless legs syndrome (RLS) 12/09/2013  . Myalgia and myositis 01/02/2014    Past Surgical History  Procedure Laterality Date  . Cosmetic surgery  1976  . Oophorectomy    . Dermoid cyst removal  1990  . Vagal nerve stimulator placement Left     Family History  Problem Relation Age of Onset  . Heart disease Mother     enlarged heart  . Hypertension Mother   . Cancer Mother    ovarian  . Cancer Father   . Diabetes Father     type 2  . Alzheimer's disease Father   . Cancer Maternal Grandfather     prostate   . Tremor Neg Hx     Social history:  reports that she has never smoked. She has never used smokeless tobacco. She reports that she drinks alcohol. She reports that she does not use illicit drugs.    Allergies  Allergen Reactions  . Elavil [Amitriptyline Hcl] Nausea And Vomiting, Nausea Only and Other (See Comments)    Dizziness;crying  . Wellbutrin [Bupropion Hcl] Other (See Comments)    Confusion  . Gabapentin     dizzy  . Lyrica [Pregabalin]     Dizzy     Medications:  Current Outpatient Prescriptions on File Prior to Visit  Medication Sig Dispense Refill  . Aspirin-Acetaminophen-Caffeine (EXCEDRIN PO) Take by mouth daily.      . busPIRone (BUSPAR) 30 MG tablet Take 1 tablet (30 mg total) by mouth 2 (two) times daily.  60 tablet  3  . FLUoxetine (PROZAC) 20 MG capsule Take 3 capsules (60 mg total) by mouth daily.  90 capsule  3  . lamoTRIgine (LAMICTAL) 200 MG tablet Take 1 tablet (200 mg total) by mouth daily. Take 1/2 tablet in the morning and 1 tablet in the evening  45 tablet  3  . Multiple Vitamin (MULTIVITAMIN) tablet Take 1  tablet by mouth daily.      Marland Kitchen rOPINIRole (REQUIP) 0.25 MG tablet Take 0.25 mg by mouth at bedtime.      . traZODone (DESYREL) 50 MG tablet Take 100 mg by mouth at bedtime. Take 1/2 to 1 at bedtime       No current facility-administered medications on file prior to visit.    ROS:  Out of a complete 14 system review of symptoms, the patient complains only of the following symptoms, and all other reviewed systems are negative.  Fatigue Runny nose, difficulty swallowing Restless legs, daytime sleepiness, snoring Joint pain, achy muscles Depression, anxiety  Blood pressure 131/77, pulse 77, height 5' 3.5" (1.613 m), weight 151 lb (68.493 kg).  Physical Exam  General: The patient is alert and cooperative at  the time of the examination. The patient is minimally to moderately obese.  Neuromuscular: The patient has fair range of movement of the cervical and lumbosacral spines.  Skin: No significant peripheral edema is noted.   Neurologic Exam  Mental status: The patient is oriented x 3.  Cranial nerves: Facial symmetry is present. Speech is normal, no aphasia or dysarthria is noted. Extraocular movements are full. Visual fields are full. A slight side-to-side head and neck tremor is noted.  Motor: The patient has good strength in all 4 extremities. The patient is able to arise from a seated position with arms crossed.  Sensory examination: Soft touch sensation is symmetric on the face, arms, and legs.  Coordination: The patient has good finger-nose-finger and heel-to-shin bilaterally.  Gait and station: The patient has a normal gait. Tandem gait is slightly unsteady. Romberg is negative. No drift is seen.  Reflexes: Deep tendon reflexes are symmetric.   Assessment/Plan:  1. Reported myalgias  2. Anxiety and depression  3. Tremor  4. Restless leg syndrome  The patient is having some discomfort in the arms and legs that is primarily at night. She feels better if she is up and moving. The patient could have fibromyalgia, but she reports mainly distal discomfort, not proximal. The patient will be given a trial on Mobic, and she will have further blood work done today. She has been intolerant of Lyrica and gabapentin previously. She will followup in March of 2015.  Marlan Palau MD 01/04/2014 2:57 PM  [facility] [address] [place], [place] [address]  Phone [phone] Fax [phone]

## 2014-01-04 LAB — ANGIOTENSIN CONVERTING ENZYME: Angio Convert Enzyme: 30 U/L (ref 14–82)

## 2014-01-04 LAB — COPPER, SERUM: Copper: 109 ug/dL (ref 72–166)

## 2014-01-04 LAB — SEDIMENTATION RATE: Sed Rate: 23 mm/hr (ref 0–40)

## 2014-01-04 LAB — CK: Total CK: 69 U/L (ref 24–173)

## 2014-01-04 LAB — VITAMIN B12: VITAMIN B 12: 622 pg/mL (ref 211–946)

## 2014-01-04 LAB — RHEUMATOID FACTOR: Rhuematoid fact SerPl-aCnc: 9.7 IU/mL (ref 0.0–13.9)

## 2014-01-07 ENCOUNTER — Telehealth: Payer: Self-pay | Admitting: Neurology

## 2014-01-07 NOTE — Telephone Encounter (Signed)
Left message to call back for results of labs or to give the OK that I can leave a message on the phone.

## 2014-01-08 NOTE — Telephone Encounter (Signed)
Patient returning call to Brandi Mora, patient states that it is okay to leave a detailed message on her voicemail.

## 2014-01-08 NOTE — Telephone Encounter (Signed)
Left message with unremarkable blood work results, per Dr. Anne HahnWillis.

## 2014-01-08 NOTE — Progress Notes (Signed)
Quick Note:  Left message that lab results were unremarkable, per Dr. Anne HahnWillis. Told to call with any questions. ______

## 2014-02-18 ENCOUNTER — Encounter: Payer: Self-pay | Admitting: Neurology

## 2014-02-24 ENCOUNTER — Encounter: Payer: Self-pay | Admitting: Neurology

## 2014-03-10 ENCOUNTER — Other Ambulatory Visit: Payer: Self-pay | Admitting: Neurology

## 2014-04-22 ENCOUNTER — Encounter: Payer: Medicare Other | Admitting: Neurology

## 2014-04-28 ENCOUNTER — Ambulatory Visit (INDEPENDENT_AMBULATORY_CARE_PROVIDER_SITE_OTHER): Payer: Self-pay | Admitting: Neurology

## 2014-04-28 ENCOUNTER — Ambulatory Visit (INDEPENDENT_AMBULATORY_CARE_PROVIDER_SITE_OTHER): Payer: Medicare Other | Admitting: Neurology

## 2014-04-28 ENCOUNTER — Encounter: Payer: Self-pay | Admitting: Neurology

## 2014-04-28 DIAGNOSIS — M791 Myalgia: Secondary | ICD-10-CM

## 2014-04-28 DIAGNOSIS — IMO0001 Reserved for inherently not codable concepts without codable children: Secondary | ICD-10-CM

## 2014-04-28 DIAGNOSIS — R29898 Other symptoms and signs involving the musculoskeletal system: Secondary | ICD-10-CM

## 2014-04-28 DIAGNOSIS — M609 Myositis, unspecified: Secondary | ICD-10-CM

## 2014-04-28 NOTE — Progress Notes (Signed)
Please refer to EMG and nerve conduction study procedure note. 

## 2014-04-28 NOTE — Procedures (Signed)
     HISTORY:  Brandi Mora is a 59 year old patient with a one-month history of episodic weakness in the legs that may last up 15-20 minutes. The patient feels normal between episodes. She is being evaluated for the leg weakness.  NERVE CONDUCTION STUDIES:  Nerve conduction studies were performed on both lower extremities. The distal motor latencies and motor amplitudes for the peroneal and posterior tibial nerves were within normal limits. The nerve conduction velocities for these nerves were also normal. The H reflex latencies were normal. The sensory latencies for the peroneal nerves were within normal limits.   EMG STUDIES:  EMG study was performed on the right lower extremity:  The tibialis anterior muscle reveals 2 to 4K motor units with full recruitment. No fibrillations or positive waves were seen. The peroneus tertius muscle reveals 2 to 4K motor units with full recruitment. No fibrillations or positive waves were seen. The medial gastrocnemius muscle reveals 1 to 3K motor units with full recruitment. No fibrillations or positive waves were seen. The vastus lateralis muscle reveals 2 to 4K motor units with full recruitment. No fibrillations or positive waves were seen. The iliopsoas muscle reveals 2 to 4K motor units with full recruitment. No fibrillations or positive waves were seen. The biceps femoris muscle (long head) reveals 2 to 4K motor units with full recruitment. No fibrillations or positive waves were seen. The lumbosacral paraspinal muscles were tested at 3 levels, and revealed no abnormalities of insertional activity at all 3 levels tested. There was good relaxation.  EMG study was performed on the left lower extremity:  The tibialis anterior muscle reveals 2 to 4K motor units with full recruitment. No fibrillations or positive waves were seen. The peroneus tertius muscle reveals 2 to 4K motor units with full recruitment. No fibrillations or positive waves were  seen. The medial gastrocnemius muscle reveals 1 to 3K motor units with full recruitment. No fibrillations or positive waves were seen. The vastus lateralis muscle reveals 2 to 4K motor units with full recruitment. No fibrillations or positive waves were seen. The iliopsoas muscle reveals 2 to 4K motor units with full recruitment. No fibrillations or positive waves were seen. The biceps femoris muscle (long head) reveals 2 to 4K motor units with full recruitment. No fibrillations or positive waves were seen. The lumbosacral paraspinal muscles were tested at 3 levels, and revealed no abnormalities of insertional activity at all 3 levels tested. There was good relaxation.   IMPRESSION:  Nerve conduction studies done on both lower extremities were within normal limits. No evidence of a peripheral neuropathy is seen. EMG evaluation on both lower extremities were unremarkable, without evidence of an overlying lumbosacral radiculopathy or evidence of a myopathic disorder.  Marlan Palau. Keith Willis MD 04/28/2014 11:03 AM  [facility] [address] [place], [place] [id number]  Phone [phone] Fax [phone]

## 2014-05-04 ENCOUNTER — Other Ambulatory Visit: Payer: Self-pay

## 2014-05-04 ENCOUNTER — Other Ambulatory Visit: Payer: Self-pay | Admitting: Family Medicine

## 2014-05-04 DIAGNOSIS — Z1231 Encounter for screening mammogram for malignant neoplasm of breast: Secondary | ICD-10-CM

## 2014-05-04 DIAGNOSIS — S0990XA Unspecified injury of head, initial encounter: Secondary | ICD-10-CM

## 2014-05-08 ENCOUNTER — Ambulatory Visit
Admission: RE | Admit: 2014-05-08 | Discharge: 2014-05-08 | Disposition: A | Discharge status: Home / Self Care | Payer: Medicare Other | Source: Ambulatory Visit

## 2014-05-08 ENCOUNTER — Other Ambulatory Visit: Payer: Medicaid Other

## 2014-05-08 DIAGNOSIS — Z1231 Encounter for screening mammogram for malignant neoplasm of breast: Secondary | ICD-10-CM

## 2014-05-13 ENCOUNTER — Ambulatory Visit
Admission: RE | Admit: 2014-05-13 | Discharge: 2014-05-13 | Disposition: A | Discharge status: Home / Self Care | Payer: Medicare Other | Source: Ambulatory Visit | Attending: Family Medicine | Admitting: Family Medicine

## 2014-05-13 DIAGNOSIS — S0990XA Unspecified injury of head, initial encounter: Secondary | ICD-10-CM

## 2014-05-22 ENCOUNTER — Other Ambulatory Visit: Payer: Self-pay | Admitting: Gastroenterology

## 2014-06-09 ENCOUNTER — Encounter: Payer: Self-pay | Admitting: Neurology

## 2014-06-09 ENCOUNTER — Ambulatory Visit (INDEPENDENT_AMBULATORY_CARE_PROVIDER_SITE_OTHER): Payer: Medicare Other | Admitting: Neurology

## 2014-06-09 VITALS — BP 135/73 | HR 79 | Ht 63.0 in | Wt 122.6 lb

## 2014-06-09 DIAGNOSIS — R251 Tremor, unspecified: Secondary | ICD-10-CM

## 2014-06-09 DIAGNOSIS — M791 Myalgia: Secondary | ICD-10-CM

## 2014-06-09 DIAGNOSIS — M609 Myositis, unspecified: Secondary | ICD-10-CM | POA: Diagnosis not present

## 2014-06-09 DIAGNOSIS — G2581 Restless legs syndrome: Secondary | ICD-10-CM | POA: Diagnosis not present

## 2014-06-09 DIAGNOSIS — IMO0001 Reserved for inherently not codable concepts without codable children: Secondary | ICD-10-CM

## 2014-06-09 MED ORDER — MELOXICAM 15 MG PO TABS: 15.0000 mg | ORAL_TABLET | Freq: Every day | ORAL | Status: DC

## 2014-06-09 NOTE — Progress Notes (Signed)
Reason for visit: Fibromyalgia  Brandi Mora is an 59 y.o. female  History of present illness:  Brandi Mora is a 59 year old right-handed white female with a history of discomfort in the legs. She has taken Motrin 15 mg daily with good improvement in the leg pain. The patient has a history of depression, she has a vagal nerve stimulator in place for this reason. She is tolerating the stimulator well, she does not feel the cycling. She is sleeping fairly well at night. She has lost weight with a diet recently. She denies any significant issues with tremor. She returns this office for further evaluation. She is not getting any stomach upset from the Mobic currently.  Past Medical History  Diagnosis Date  . Depression   . Anemia   . Anxiety   . High cholesterol   . Fibromyalgia   . Tremor 09/09/2013  . Mildly obese   . Restless legs syndrome (RLS) 12/09/2013  . Myalgia and myositis 01/02/2014    Past Surgical History  Procedure Laterality Date  . Cosmetic surgery  1976  . Oophorectomy    . Dermoid cyst removal  1990  . Vagal nerve stimulator placement Left     Family History  Problem Relation Age of Onset  . Heart disease Mother     enlarged heart  . Hypertension Mother   . Cancer Mother     ovarian  . Cancer Father   . Diabetes Father     type 2  . Alzheimer's disease Father   . Cancer Maternal Grandfather     prostate   . Tremor Neg Hx     Social history:  reports that she has never smoked. She has never used smokeless tobacco. She reports that she drinks alcohol. She reports that she does not use illicit drugs.    Allergies  Allergen Reactions  . Elavil [Amitriptyline Hcl] Nausea And Vomiting, Nausea Only and Other (See Comments)    Dizziness;crying  . Wellbutrin [Bupropion Hcl] Other (See Comments)    Confusion  . Penicillins Rash    Rash all over body.  . Gabapentin     dizzy  . Lyrica [Pregabalin]     Dizzy     Medications:  Prior to  Admission medications   Medication Sig Start Date End Date Taking? Authorizing Provider  Aspirin-Acetaminophen-Caffeine (EXCEDRIN PO) Take by mouth daily.   Yes Historical Provider, MD  busPIRone (BUSPAR) 30 MG tablet Take 1 tablet (30 mg total) by mouth 2 (two) times daily. 11/13/11  Yes Thao P Le, DO  FLUoxetine (PROZAC) 20 MG capsule Take 3 capsules (60 mg total) by mouth daily. 11/13/11  Yes Thao P Le, DO  lamoTRIgine (LAMICTAL) 200 MG tablet Take 1 tablet (200 mg total) by mouth daily. Take 1/2 tablet in the morning and 1 tablet in the evening 11/13/11  Yes Thao P Le, DO  meloxicam (MOBIC) 15 MG tablet TAKE ONE TABLET BY MOUTH ONCE DAILY 03/10/14  Yes York Spanielharles K Jacqualynn Parco, MD  Multiple Vitamin (MULTIVITAMIN) tablet Take 1 tablet by mouth daily.   Yes Historical Provider, MD  rOPINIRole (REQUIP) 0.25 MG tablet Take 0.25 mg by mouth at bedtime.   Yes Historical Provider, MD  traZODone (DESYREL) 50 MG tablet Take 100 mg by mouth at bedtime. Take 1/2 to 1 at bedtime   Yes Historical Provider, MD    ROS:  Out of a complete 14 system review of symptoms, the patient complains only of the following symptoms,  and all other reviewed systems are negative.  Runny nose Restless legs, daytime sleepiness, snoring Bruising easily Agitation, depression, anxiety  Blood pressure 135/73, pulse 79, height  (1.6 m), weight 122 lb 9.6 oz (55.611 kg).  Physical Exam  General: The patient is alert and cooperative at the time of the examination.  Skin: No significant peripheral edema is noted.   Neurologic Exam  Mental status: The patient is oriented x 3.  Cranial nerves: Facial symmetry is present. Speech is normal, no aphasia or dysarthria is noted. Extraocular movements are full. Visual fields are full.  Motor: The patient has good strength in all 4 extremities.  Sensory examination: Soft touch sensation is symmetric on the face, arms, and legs.  Coordination: The patient has good  finger-nose-finger and heel-to-shin bilaterally.  Gait and station: The patient has a normal gait. Tandem gait is normal. Romberg is negative. No drift is seen.  Reflexes: Deep tendon reflexes are symmetric.   Assessment/Plan:  1. Fibromyalgia  2. Depression  3. History of tremor  4. Vagal nerve stimulator placement  The patient is doing well currently with the Mobic, she will continue the medication. A prescription was called in. She will follow-up in about 6 months. We will reevaluate the vagal nerve stimulator at that point. She is apparently doing better with her depression at this time. She indicates that her primary care physician has checked annual blood work in January 2016.  Marlan Palau MD 06/09/2014 7:48 PM  [facility] [address] [place], [place] [address]  Phone [phone] Fax [phone]

## 2014-06-09 NOTE — Patient Instructions (Signed)
Fibromyalgia Fibromyalgia is a disorder that is often misunderstood. It is associated with muscular pains and tenderness that comes and goes. It is often associated with fatigue and sleep disturbances. Though it tends to be long-lasting, fibromyalgia is not life-threatening. CAUSES  The exact cause of fibromyalgia is unknown. People with certain gene types are predisposed to developing fibromyalgia and other conditions. Certain factors can play a role as triggers, such as:  Spine disorders.  Arthritis.  Severe injury (trauma) and other physical stressors.  Emotional stressors. SYMPTOMS   The main symptom is pain and stiffness in the muscles and joints, which can vary over time.  Sleep and fatigue problems. Other related symptoms may include:  Bowel and bladder problems.  Headaches.  Visual problems.  Problems with odors and noises.  Depression or mood changes.  Painful periods (dysmenorrhea).  Dryness of the skin or eyes. DIAGNOSIS  There are no specific tests for diagnosing fibromyalgia. Patients can be diagnosed accurately from the specific symptoms they have. The diagnosis is made by determining that nothing else is causing the problems. TREATMENT  There is no cure. Management includes medicines and an active, healthy lifestyle. The goal is to enhance physical fitness, decrease pain, and improve sleep. HOME CARE INSTRUCTIONS   Only take over-the-counter or prescription medicines as directed by your caregiver. Sleeping pills, tranquilizers, and pain medicines may make your problems worse.  Low-impact aerobic exercise is very important and advised for treatment. At first, it may seem to make pain worse. Gradually increasing your tolerance will overcome this feeling.  Learning relaxation techniques and how to control stress will help you. Biofeedback, visual imagery, hypnosis, muscle relaxation, yoga, and meditation are all options.  Anti-inflammatory medicines and  physical therapy may provide short-term help.  Acupuncture or massage treatments may help.  Take muscle relaxant medicines as suggested by your caregiver.  Avoid stressful situations.  Plan a healthy lifestyle. This includes your diet, sleep, rest, exercise, and friends.  Find and practice a hobby you enjoy.  Join a fibromyalgia support group for interaction, ideas, and sharing advice. This may be helpful. SEEK MEDICAL CARE IF:  You are not having good results or improvement from your treatment. FOR MORE INFORMATION  National Fibromyalgia Association: www.fmaware.org Arthritis Foundation: www.arthritis.org Document Released: 03/20/2005 Document Revised: 06/12/2011 Document Reviewed: 06/30/2009 [employer] Patient Information 2015 [employer]. This information is not intended to replace advice given to you by your health care provider. Make sure you discuss any questions you have with your health care provider.  

## 2014-10-27 ENCOUNTER — Other Ambulatory Visit: Payer: Self-pay | Admitting: Family Medicine

## 2014-10-27 DIAGNOSIS — R112 Nausea with vomiting, unspecified: Secondary | ICD-10-CM

## 2014-10-27 DIAGNOSIS — N6459 Other signs and symptoms in breast: Secondary | ICD-10-CM

## 2014-10-29 ENCOUNTER — Ambulatory Visit
Admission: RE | Admit: 2014-10-29 | Discharge: 2014-10-29 | Disposition: A | Discharge status: Home / Self Care | Payer: Medicare Other | Source: Ambulatory Visit | Attending: Family Medicine | Admitting: Family Medicine

## 2014-10-29 ENCOUNTER — Other Ambulatory Visit: Payer: Self-pay | Admitting: Family Medicine

## 2014-10-29 DIAGNOSIS — N6310 Unspecified lump in the right breast, unspecified quadrant: Secondary | ICD-10-CM

## 2014-10-29 DIAGNOSIS — N6459 Other signs and symptoms in breast: Secondary | ICD-10-CM

## 2014-10-30 ENCOUNTER — Other Ambulatory Visit: Payer: Self-pay | Admitting: Gastroenterology

## 2014-10-30 DIAGNOSIS — R1084 Generalized abdominal pain: Secondary | ICD-10-CM

## 2014-10-30 DIAGNOSIS — R634 Abnormal weight loss: Secondary | ICD-10-CM

## 2014-10-30 DIAGNOSIS — R112 Nausea with vomiting, unspecified: Secondary | ICD-10-CM

## 2014-11-02 ENCOUNTER — Ambulatory Visit
Admission: RE | Admit: 2014-11-02 | Discharge: 2014-11-02 | Disposition: A | Discharge status: Home / Self Care | Payer: Medicare Other | Source: Ambulatory Visit | Attending: Family Medicine | Admitting: Family Medicine

## 2014-11-02 DIAGNOSIS — R112 Nausea with vomiting, unspecified: Secondary | ICD-10-CM

## 2014-11-04 ENCOUNTER — Ambulatory Visit
Admission: RE | Admit: 2014-11-04 | Discharge: 2014-11-04 | Disposition: A | Discharge status: Home / Self Care | Payer: Medicare Other | Source: Ambulatory Visit | Attending: Gastroenterology | Admitting: Gastroenterology

## 2014-11-04 DIAGNOSIS — R634 Abnormal weight loss: Secondary | ICD-10-CM

## 2014-11-04 DIAGNOSIS — R112 Nausea with vomiting, unspecified: Secondary | ICD-10-CM

## 2014-11-04 DIAGNOSIS — R1084 Generalized abdominal pain: Secondary | ICD-10-CM

## 2014-11-04 MED ORDER — IOPAMIDOL (ISOVUE-300) INJECTION 61%
100.0000 mL | Freq: Once | INTRAVENOUS | Status: AC | PRN
  Administered 2014-11-04: 100 mL via INTRAVENOUS

## 2014-11-12 ENCOUNTER — Other Ambulatory Visit: Payer: Self-pay | Admitting: Gastroenterology

## 2014-12-16 ENCOUNTER — Ambulatory Visit (INDEPENDENT_AMBULATORY_CARE_PROVIDER_SITE_OTHER): Payer: Medicare Other | Admitting: Neurology

## 2014-12-16 ENCOUNTER — Encounter: Payer: Self-pay | Admitting: Neurology

## 2014-12-16 VITALS — BP 119/67 | HR 73 | Ht 63.0 in | Wt 103.0 lb

## 2014-12-16 DIAGNOSIS — M791 Myalgia: Secondary | ICD-10-CM | POA: Diagnosis not present

## 2014-12-16 DIAGNOSIS — M609 Myositis, unspecified: Secondary | ICD-10-CM | POA: Diagnosis not present

## 2014-12-16 DIAGNOSIS — IMO0001 Reserved for inherently not codable concepts without codable children: Secondary | ICD-10-CM

## 2014-12-16 NOTE — Progress Notes (Signed)
Reason for visit: Arm pain  Brandi Mora is an 59 y.o. female  History of present illness:  Brandi Mora is a 59 year old right-handed white female with a history of depression. The patient had been seen previously for lower extremity discomfort. She was placed on Mobic, with apparently she has developed peptic ulcer disease, diagnosed over the last several weeks. The patient is off of Mobic currently, and she is on omeprazole. She is doing better, she was having some nausea previously after eating, but this has resolved. She has lost some weight. The patient has gotten much better with her leg pain, but now she reports some discomfort in the right arm. This will come on at night when she lies down, she will have to get up and move about to get rid of the pain. The pain involves the right shoulder down to the right hand, she denies any tingling or numb sensations. She returns to this office for an evaluation. She indicates that her depression is under relatively good control, she can feel the vagal nerve stimulator cycling, there is no discomfort with this. She denies any neck pain or pain down the arm.  Past Medical History  Diagnosis Date  . Depression   . Anemia   . Anxiety   . High cholesterol   . Fibromyalgia   . Tremor 09/09/2013  . Mildly obese   . Restless legs syndrome (RLS) 12/09/2013  . Myalgia and myositis 01/02/2014    Past Surgical History  Procedure Laterality Date  . Cosmetic surgery  1976  . Oophorectomy    . Dermoid cyst removal  1990  . Vagal nerve stimulator placement Left     Family History  Problem Relation Age of Onset  . Heart disease Mother     enlarged heart  . Hypertension Mother   . Cancer Mother     ovarian  . Cancer Father   . Diabetes Father     type 2  . Alzheimer's disease Father   . Cancer Maternal Grandfather     prostate   . Tremor Neg Hx     Social history:  reports that she has never smoked. She has never used smokeless  tobacco. She reports that she does not drink alcohol or use illicit drugs.    Allergies  Allergen Reactions  . Elavil [Amitriptyline Hcl] Nausea And Vomiting, Nausea Only and Other (See Comments)    Dizziness;crying  . Wellbutrin [Bupropion Hcl] Other (See Comments)    Confusion  . Penicillins Rash    Rash all over body.  . Gabapentin     dizzy  . Lyrica [Pregabalin]     Dizzy     Medications:  Prior to Admission medications   Medication Sig Start Date End Date Taking? Authorizing Provider  busPIRone (BUSPAR) 30 MG tablet Take 1 tablet (30 mg total) by mouth 2 (two) times daily. 11/13/11  Yes Thao P Le, DO  FLUoxetine (PROZAC) 20 MG capsule Take 3 capsules (60 mg total) by mouth daily. 11/13/11  Yes Thao P Le, DO  lamoTRIgine (LAMICTAL) 200 MG tablet Take 1 tablet (200 mg total) by mouth daily. Take 1/2 tablet in the morning and 1 tablet in the evening 11/13/11  Yes Thao P Le, DO  Multiple Vitamin (MULTIVITAMIN) tablet Take 1 tablet by mouth daily.   Yes Historical Provider, MD  omeprazole (PRILOSEC) 40 MG capsule Take 40 mg by mouth 2 (two) times daily. 11/13/14  Yes Historical Provider, MD  rOPINIRole (  REQUIP) 0.25 MG tablet Take 0.25 mg by mouth at bedtime.   Yes Historical Provider, MD  sucralfate (CARAFATE) 1 G tablet Take 1 g by mouth 4 (four) times daily. 11/13/14  Yes Historical Provider, MD  traZODone (DESYREL) 50 MG tablet Take 100 mg by mouth at bedtime. Take 1/2 to 1 at bedtime   Yes Historical Provider, MD    ROS:  Out of a complete 14 system review of symptoms, the patient complains only of the following symptoms, and all other reviewed systems are negative.  Right arm pain  Blood pressure 119/67, pulse 73, height  (1.6 m), weight 103 lb (46.72 kg).  Physical Exam  General: The patient is alert and cooperative at the time of the examination.  Skin: No significant peripheral edema is noted.   Neurologic Exam  Mental status: The patient is alert and  oriented x 3 at the time of the examination. The patient has apparent normal recent and remote memory, with an apparently normal attention span and concentration ability.   Cranial nerves: Facial symmetry is present. Speech is normal, no aphasia or dysarthria is noted. Extraocular movements are full. Visual fields are full. The patient has a flat affect.  Motor: The patient has good strength in all 4 extremities.  Sensory examination: Soft touch sensation is symmetric on the face, arms, and legs.  Coordination: The patient has good finger-nose-finger and heel-to-shin bilaterally. Tinel sign at the wrists are negative bilaterally.  Gait and station: The patient has a normal gait. Tandem gait is slightly unsteady. Romberg is negative. No drift is seen.  Reflexes: Deep tendon reflexes are symmetric.   Assessment/Plan:  1. Depression  2. Reports of right arm discomfort  The patient has reports of discomfort in the right arm at night with lying down. This is better when she is up and moving. The patient will be set up for nerve conduction studies of both arms, EMG of the right arm. Tinel's sign at the wrists are negative bilaterally.  Marlan Palau MD 12/16/2014 8:55 PM  [facility] [address] [place], [place] [address]  Phone [phone] Fax [phone]

## 2014-12-16 NOTE — Patient Instructions (Signed)
  We will investigate the right arm pain with a NCV and EMG evaluation.   Fibromyalgia Fibromyalgia is a disorder that is often misunderstood. It is associated with muscular pains and tenderness that comes and goes. It is often associated with fatigue and sleep disturbances. Though it tends to be long-lasting, fibromyalgia is not life-threatening. CAUSES  The exact cause of fibromyalgia is unknown. People with certain gene types are predisposed to developing fibromyalgia and other conditions. Certain factors can play a role as triggers, such as:  Spine disorders.  Arthritis.  Severe injury (trauma) and other physical stressors.  Emotional stressors. SYMPTOMS   The main symptom is pain and stiffness in the muscles and joints, which can vary over time.  Sleep and fatigue problems. Other related symptoms may include:  Bowel and bladder problems.  Headaches.  Visual problems.  Problems with odors and noises.  Depression or mood changes.  Painful periods (dysmenorrhea).  Dryness of the skin or eyes. DIAGNOSIS  There are no specific tests for diagnosing fibromyalgia. Patients can be diagnosed accurately from the specific symptoms they have. The diagnosis is made by determining that nothing else is causing the problems. TREATMENT  There is no cure. Management includes medicines and an active, healthy lifestyle. The goal is to enhance physical fitness, decrease pain, and improve sleep. HOME CARE INSTRUCTIONS   Only take over-the-counter or prescription medicines as directed by your caregiver. Sleeping pills, tranquilizers, and pain medicines may make your problems worse.  Low-impact aerobic exercise is very important and advised for treatment. At first, it may seem to make pain worse. Gradually increasing your tolerance will overcome this feeling.  Learning relaxation techniques and how to control stress will help you. Biofeedback, visual imagery, hypnosis, muscle relaxation,  yoga, and meditation are all options.  Anti-inflammatory medicines and physical therapy may provide short-term help.  Acupuncture or massage treatments may help.  Take muscle relaxant medicines as suggested by your caregiver.  Avoid stressful situations.  Plan a healthy lifestyle. This includes your diet, sleep, rest, exercise, and friends.  Find and practice a hobby you enjoy.  Join a fibromyalgia support group for interaction, ideas, and sharing advice. This may be helpful. SEEK MEDICAL CARE IF:  You are not having good results or improvement from your treatment. FOR MORE INFORMATION  National Fibromyalgia Association: www.fmaware.org Arthritis Foundation: www.arthritis.org Document Released: 03/20/2005 Document Revised: 06/12/2011 Document Reviewed: 06/30/2009 [facility] Patient Information 2015 [place], [place]. This information is not intended to replace advice given to you by your health care provider. Make sure you discuss any questions you have with your health care provider.

## 2015-01-12 ENCOUNTER — Encounter: Payer: Medicare Other | Admitting: Neurology

## 2015-02-03 ENCOUNTER — Encounter: Payer: Medicare Other | Admitting: Neurology

## 2015-03-02 ENCOUNTER — Ambulatory Visit (INDEPENDENT_AMBULATORY_CARE_PROVIDER_SITE_OTHER): Payer: Medicare Other | Admitting: Neurology

## 2015-03-02 ENCOUNTER — Telehealth: Payer: Self-pay | Admitting: Neurology

## 2015-03-02 DIAGNOSIS — IMO0001 Reserved for inherently not codable concepts without codable children: Secondary | ICD-10-CM

## 2015-03-02 DIAGNOSIS — M609 Myositis, unspecified: Secondary | ICD-10-CM | POA: Diagnosis not present

## 2015-03-02 DIAGNOSIS — M791 Myalgia: Secondary | ICD-10-CM | POA: Diagnosis not present

## 2015-03-02 NOTE — Telephone Encounter (Signed)
I called the patient. Nerve conduction studies of both arms were unremarkable, EMG of the right arm was unremarkable. If the patient continues to have ongoing symptoms involving the right arm, we will consider MRI of the cervical spine. She is to contact our office if this is the case.

## 2015-03-02 NOTE — Procedures (Signed)
[facility]  [address], [address]  [place], [place] [address] Tel: [phone] Fax:  [phone] Test Date:  03/02/2015  Patient: Brandi Mora DOB: 07-28-1955 Physician: Nita Sickle  Sex: Female Height:  Ref Phys: Dr Anne Hahn  ID#: [id number] Temp: 33.6C Technician: Judie Petit. Dean   Patient Complaints: This is a 59 year old female referred for evaluation of right shoulder and arm pain.  NCV & EMG Findings: Extensive electrodiagnostic testing of the right upper extremity and additional studies of the left shows: 1. Bilateral median, ulnar and palmar sensory responses are within normal limits. 2. Bilateral median and ulnar motor responses are within normal limits. 3. There is no evidence of active or chronic motor axon loss changes affecting any of the tested muscles. Motor unit configuration and recruitment pattern is within normal limits.  Impression: This is a normal study of the upper extremities.  In particular, there is no evidence of a diffuse myopathy, carpal tunnel syndrome, or a cervical radiculopathy affecting the right upper extremity.   _____________________________ Nita Sickle, D.O.    Nerve Conduction Studies Anti Sensory Summary Table   Stim Site NR Peak (ms) Norm Peak (ms) P-T Amp (V) Norm P-T Amp  Left Median Anti Sensory (2nd Digit)  Wrist    3.3 <3.8 34.5 >10  Right Median Anti Sensory (2nd Digit)  Wrist    3.3 <3.8 28.9 >10  Left Ulnar Anti Sensory (5th Digit)  Wrist    3.1 <3.2 23.5 >5  Right Ulnar Anti Sensory (5th Digit)  Wrist    3.1 <3.2 24.5 >5   Motor Summary Table   Stim Site NR Onset (ms) Norm Onset (ms) O-P Amp (mV) Norm O-P Amp Site1 Site2 Delta-0 (ms) Dist (cm) Vel (m/s) Norm Vel (m/s)  Left Median Motor (Abd Poll Brev)  Wrist    3.2 <4.0 5.8 >5 Elbow Wrist 3.4 20.0 59 >50  Elbow    6.6  5.1         Right Median Motor (Abd Poll Brev)  Wrist    3.5 <4.0 7.4 >5 Elbow Wrist 3.8 22.0 58 >50  Elbow    7.3  6.2           Left Ulnar Motor (Abd Dig Minimi)  Wrist    2.6 <3.1 9.2 >7 B Elbow Wrist 3.2 17.0 53 >50  B Elbow    5.8  8.5  A Elbow B Elbow 1.7 10.0 59 >50  A Elbow    7.5  8.3         Right Ulnar Motor (Abd Dig Minimi)  Wrist    2.5 <3.1 9.2 >7 B Elbow Wrist 3.3 18.0 55 >50  B Elbow    5.8  8.6  A Elbow B Elbow 1.7 10.0 59 >50  A Elbow    7.5  8.4          Comparison Summary Table   Stim Site NR Peak (ms) Norm Peak (ms) P-T Amp (V) Site1 Site2 Delta-P (ms) Norm Delta (ms)  Left Median/Ulnar Palm Comparison (Wrist - 8cm)  Median Palm    1.8 <2.2 62.7 Median Palm Ulnar Palm 0.3   Ulnar Palm    1.5 <2.2 12.0      Right Median/Ulnar Palm Comparison (Wrist - 8cm)  Median Palm    1.9 <2.2 93.4 Median Palm Ulnar Palm 0.1   Ulnar Palm    1.8 <2.2 47.4       EMG   Side Muscle Ins Act Fibs  Psw Fasc Number Recrt Dur Dur. Amp Amp. Poly Poly. Comment  Right 1stDorInt Nml Nml Nml Nml Nml Nml Nml Nml Nml Nml Nml Nml N/A  Right Ext Indicis Nml Nml Nml Nml Nml Nml Nml Nml Nml Nml Nml Nml N/A  Right PronatorTeres Nml Nml Nml Nml Nml Nml Nml Nml Nml Nml Nml Nml N/A  Right Biceps Nml Nml Nml Nml Nml Nml Nml Nml Nml Nml Nml Nml N/A  Right Triceps Nml Nml Nml Nml Nml Nml Nml Nml Nml Nml Nml Nml N/A  Right Deltoid Nml Nml Nml Nml Nml Nml Nml Nml Nml Nml Nml Nml N/A  Right Cervical Parasp Low Nml Nml Nml Nml Nml Nml Nml Nml Nml Nml Nml Nml N/A  Right Infraspinatus Nml Nml Nml Nml Nml Nml Nml Nml Nml Nml Nml Nml N/A      Waveforms:

## 2015-03-03 ENCOUNTER — Encounter: Payer: Medicare Other | Admitting: Neurology

## 2015-12-07 ENCOUNTER — Other Ambulatory Visit: Payer: Self-pay | Admitting: Family Medicine

## 2015-12-07 DIAGNOSIS — Z1231 Encounter for screening mammogram for malignant neoplasm of breast: Secondary | ICD-10-CM

## 2015-12-16 ENCOUNTER — Ambulatory Visit: Payer: Medicare Other

## 2016-01-03 ENCOUNTER — Ambulatory Visit
Admission: RE | Admit: 2016-01-03 | Discharge: 2016-01-03 | Disposition: A | Discharge status: Home / Self Care | Payer: Medicare Other | Source: Ambulatory Visit | Attending: Family Medicine | Admitting: Family Medicine

## 2016-01-03 DIAGNOSIS — Z1231 Encounter for screening mammogram for malignant neoplasm of breast: Secondary | ICD-10-CM

## 2016-11-28 ENCOUNTER — Other Ambulatory Visit: Payer: Self-pay | Admitting: Family Medicine

## 2016-11-28 DIAGNOSIS — Z1231 Encounter for screening mammogram for malignant neoplasm of breast: Secondary | ICD-10-CM

## 2017-01-03 ENCOUNTER — Ambulatory Visit: Payer: Medicare Other

## 2017-01-10 ENCOUNTER — Ambulatory Visit
Admission: RE | Admit: 2017-01-10 | Discharge: 2017-01-10 | Disposition: A | Discharge status: Home / Self Care | Payer: Medicare Other | Source: Ambulatory Visit | Attending: Family Medicine | Admitting: Family Medicine

## 2017-01-10 DIAGNOSIS — Z1231 Encounter for screening mammogram for malignant neoplasm of breast: Secondary | ICD-10-CM

## 2018-04-01 ENCOUNTER — Other Ambulatory Visit: Payer: Self-pay | Admitting: Family Medicine

## 2018-04-01 DIAGNOSIS — Z1231 Encounter for screening mammogram for malignant neoplasm of breast: Secondary | ICD-10-CM

## 2018-05-03 ENCOUNTER — Ambulatory Visit
Admission: RE | Admit: 2018-05-03 | Discharge: 2018-05-03 | Disposition: A | Discharge status: Home / Self Care | Payer: Medicare Other | Source: Ambulatory Visit | Attending: Family Medicine | Admitting: Family Medicine

## 2018-05-03 DIAGNOSIS — Z1231 Encounter for screening mammogram for malignant neoplasm of breast: Secondary | ICD-10-CM

## 2018-06-11 ENCOUNTER — Encounter

## 2018-06-11 ENCOUNTER — Ambulatory Visit (INDEPENDENT_AMBULATORY_CARE_PROVIDER_SITE_OTHER): Payer: Medicare Other | Admitting: Neurology

## 2018-06-11 ENCOUNTER — Encounter: Payer: Self-pay | Admitting: Neurology

## 2018-06-11 VITALS — BP 146/82 | HR 75 | Ht 63.5 in | Wt 143.0 lb

## 2018-06-11 DIAGNOSIS — R413 Other amnesia: Secondary | ICD-10-CM | POA: Diagnosis not present

## 2018-06-11 DIAGNOSIS — R269 Unspecified abnormalities of gait and mobility: Secondary | ICD-10-CM

## 2018-06-11 NOTE — Progress Notes (Signed)
Reason for visit: Gait disorder, memory disorder  Referring physician: Dr. Ivonne Andrew is a 63 y.o. female  History of present illness:  Brandi Mora is a 63 year old right-handed white female with a history of significant depression and a history of fibromyalgia in the past.  The patient has not been seen through this office since 2016.  The patient had EMG nerve conduction study of the lower extremities at that time, no evidence of a peripheral neuropathy was seen.  The patient reports a history of some troubles with gait instability since she was 63 years of age.  She has had some mild worsening of her gait instability over the years, she has fallen twice within the last day or 2.  She reports no back pain or neck pain, she reports no numbness or discomfort down the arms or legs.  She feels at times that the legs are slightly weak, she does not have any weakness of the arms.  She also reports some troubles with memory over the last 4 or 5 years.  She has word finding problems and difficulty with spelling and difficulty remembering names for people.  She is able to keep up with her medications and appointments, she is able to operate a motor vehicle without difficulty.  She does report a significant issue with motion sickness that has been present for many years.  She does not use a cane for ambulation.  She has a vagal nerve stimulator in place, but she no longer feels the cycling of the stimulator.  She has had a recent vitamin B12 level checked, the patient does not know the results of this study.  The patient claims that her sleeping habits are irregular, but in general she is getting enough sleep.  Past Medical History:  Diagnosis Date  . Anemia   . Anxiety   . Depression   . Fibromyalgia   . High cholesterol   . Mildly obese   . Myalgia and myositis 01/02/2014  . Restless legs syndrome (RLS) 12/09/2013  . Tremor 09/09/2013    Past Surgical History:  Procedure  Laterality Date  . COSMETIC SURGERY  1976  . dermoid cyst removal  1990  . OOPHORECTOMY    . vagal nerve stimulator placement Left     Family History  Problem Relation Age of Onset  . Heart disease Mother        enlarged heart  . Hypertension Mother   . Cancer Mother        ovarian  . Cancer Father        Prostate/skin  . Diabetes Father        type 2  . Alzheimer's disease Father   . Cancer Maternal Grandfather        prostate   . Breast cancer Paternal Aunt   . Tremor Neg Hx     Social history:  reports that she has never smoked. She has never used smokeless tobacco. She reports current alcohol use. She reports that she does not use drugs.  Medications:  Prior to Admission medications   Medication Sig Start Date End Date Taking? Authorizing Provider  Acetaminophen (TYLENOL PO) Take by mouth.   Yes [provider]  busPIRone (BUSPAR) 30 MG tablet Take 1 tablet (30 mg total) by mouth 2 (two) times daily. 11/13/11  Yes Le, Thao P, DO  caffeine 200 MG TABS tablet Take 200 mg by mouth every 4 (four) hours as needed.   Yes [provider]  FLUoxetine (PROZAC) 20 MG capsule Take 3 capsules (60 mg total) by mouth daily. 11/13/11  Yes Le, Thao P, DO  gabapentin (NEURONTIN) 300 MG capsule Take 300 mg by mouth 2 (two) times daily.   Yes [provider]  lamoTRIgine (LAMICTAL) 200 MG tablet Take 1 tablet (200 mg total) by mouth daily. Take 1/2 tablet in the morning and 1 tablet in the evening 11/13/11  Yes Le, Thao P, DO  meclizine (ANTIVERT) 25 MG tablet Take 25 mg by mouth 3 (three) times daily as needed for dizziness.   Yes [provider]  omeprazole (PRILOSEC) 40 MG capsule Take 40 mg by mouth 2 (two) times daily. 11/13/14  Yes [provider]  rOPINIRole (REQUIP) 0.25 MG tablet Take 0.5 mg by mouth at bedtime.    Yes [provider]  traMADol (ULTRAM) 50 MG tablet Take by mouth every 6 (six) hours as needed.   Yes [provider]      Allergies  Allergen Reactions  . Elavil [Amitriptyline Hcl] Nausea And Vomiting, Nausea Only and Other (See Comments)    Dizziness;crying  . Wellbutrin [Bupropion Hcl] Other (See Comments)    Confusion    ROS:  Out of a complete 14 system review of symptoms, the patient complains only of the following symptoms, and all other reviewed systems are negative.  Weight gain, fatigue Difficulty swallowing Skin rash, itching Snoring Constipation Urination problems, occasional incontinence Easy bruising Runny nose Memory loss, numbness, difficulty swallowing, dizziness Depression, anxiety, too much sleep, not enough sleep, decreased energy Sleepiness, restless legs  Blood pressure (!) 146/82, pulse 75, height 5' 3.5" (1.613 m), weight 143 lb (64.9 kg).  Physical Exam  General: The patient is alert and cooperative at the time of the examination.  The patient is mildly obese.  Eyes: Pupils are equal, round, and reactive to light. Discs are flat bilaterally.  Neck: The neck is supple, no carotid bruits are noted.  Respiratory: The respiratory examination is clear.  Cardiovascular: The cardiovascular examination reveals a regular rate and rhythm, no obvious murmurs or rubs are noted.  Skin: Extremities are without significant edema.  Neurologic Exam  Mental status: The patient is alert and oriented x 3 at the time of the examination. The patient has apparent normal recent and remote memory, with an apparently normal attention span and concentration ability.  The Mini-Mental status examination done today shows a total score of 30/30.  Cranial nerves: Facial symmetry is present. There is good sensation of the face to pinprick and soft touch bilaterally. The strength of the facial muscles and the muscles to head turning and shoulder shrug are normal bilaterally. Speech is well enunciated, no aphasia or dysarthria is noted. Extraocular movements are full. Visual  fields are full. The tongue is midline, and the patient has symmetric elevation of the soft palate. No obvious hearing deficits are noted.  Motor: The motor testing reveals 5 over 5 strength of all 4 extremities. Good symmetric motor tone is noted throughout.  Sensory: Sensory testing is intact to pinprick, soft touch, vibration sensation, and position sense on all 4 extremities. No evidence of extinction is noted.  Coordination: Cerebellar testing reveals good finger-nose-finger and heel-to-shin bilaterally.  Gait and station: Gait is normal. Tandem gait is slightly unsteady. Romberg is negative. No drift is seen.  Reflexes: Deep tendon reflexes are symmetric and normal bilaterally. Toes are downgoing bilaterally.   Assessment/Plan:  1.  Reported memory disturbance  2.  Mild  gait disturbance  3.  History of depression  4.  History of vagal nerve stimulator placement, currently nonfunctional  The vagal nerve stimulator was interrogated, the interrogator was not able to make contact with stimulator indicating that the battery is dead.  The patient reports a long duration issue with a mild gait disturbance, her clinical examination reveals that she is somewhat fidgety to include mild vocalizations.  The patient could potentially have a low-grade tardive movement disorder.  The patient will be sent for physical therapy for gait training.  Given the reports of memory problems, we will send her for MRI of the brain.  We will follow the memory issues over time, she will follow-up in 6 months.  Marlan Palau MD 06/11/2018 3:30 PM  [facility] [address] [place], [place] [address]  Phone [phone] Fax [phone]

## 2018-06-12 ENCOUNTER — Telehealth: Payer: Self-pay | Admitting: Neurology

## 2018-06-12 NOTE — Telephone Encounter (Signed)
[employer] medicare/medicaid order sent to [facility]. No auth they will reach out to the pt to schedule.  °

## 2018-11-07 ENCOUNTER — Other Ambulatory Visit: Payer: Self-pay | Admitting: Gastroenterology

## 2018-11-07 DIAGNOSIS — K269 Duodenal ulcer, unspecified as acute or chronic, without hemorrhage or perforation: Secondary | ICD-10-CM

## 2018-11-13 ENCOUNTER — Ambulatory Visit
Admission: RE | Admit: 2018-11-13 | Discharge: 2018-11-13 | Disposition: A | Discharge status: Home / Self Care | Payer: Medicare Other | Source: Ambulatory Visit | Attending: Gastroenterology | Admitting: Gastroenterology

## 2018-11-13 DIAGNOSIS — K269 Duodenal ulcer, unspecified as acute or chronic, without hemorrhage or perforation: Secondary | ICD-10-CM

## 2018-12-03 ENCOUNTER — Other Ambulatory Visit: Payer: Self-pay | Admitting: Family Medicine

## 2018-12-03 DIAGNOSIS — R1013 Epigastric pain: Secondary | ICD-10-CM

## 2018-12-11 ENCOUNTER — Ambulatory Visit
Admission: RE | Admit: 2018-12-11 | Discharge: 2018-12-11 | Disposition: A | Discharge status: Home / Self Care | Payer: Medicare Other | Source: Ambulatory Visit | Attending: Family Medicine | Admitting: Family Medicine

## 2018-12-11 ENCOUNTER — Other Ambulatory Visit: Payer: Self-pay

## 2018-12-11 DIAGNOSIS — R1013 Epigastric pain: Secondary | ICD-10-CM

## 2018-12-16 ENCOUNTER — Encounter: Payer: Self-pay | Admitting: Neurology

## 2018-12-16 ENCOUNTER — Other Ambulatory Visit: Payer: Self-pay

## 2018-12-16 ENCOUNTER — Ambulatory Visit (INDEPENDENT_AMBULATORY_CARE_PROVIDER_SITE_OTHER): Payer: Medicare Other | Admitting: Neurology

## 2018-12-16 DIAGNOSIS — R413 Other amnesia: Secondary | ICD-10-CM | POA: Insufficient documentation

## 2018-12-16 DIAGNOSIS — R269 Unspecified abnormalities of gait and mobility: Secondary | ICD-10-CM | POA: Insufficient documentation

## 2018-12-16 NOTE — Progress Notes (Signed)
PATIENT: Brandi Mora DOB: 15-Jun-1955  REASON FOR VISIT: follow up HISTORY FROM: patient  HISTORY OF PRESENT ILLNESS: Today 12/16/18  Brandi Mora is a 63 year old female with history of significant depression and fibromyalgia.  She reports trouble with gait instability since she was 63 years old.  Over the years she has had mild worsening of her gait.  For the last several years she has reported trouble with her memory.  She has word finding problems and difficulty spelling and remembering names.  She has a vagal nerve stimulator in place that is no longer functioning.  She had a stimulator placed for depression.  After last visit, she was sent for physical therapy gait training.  MRI of the brain was ordered at last visit but was not completed.  Her last memory score was 30/30. When she sees movement, swinging or rocking she feels nauseated. She describes this as motion sickness that has been present for many years. She drives a car. She has not had any falls.  She lives alone.  She is able to manage her finances, medications, and appointments. She is on disability.  She presents today for follow-up unaccompanied.  HISTORY 06/11/2018 Brandi Mora: Brandi Mora is a 63 year old right-handed white female with a history of significant depression and a history of fibromyalgia in the past.  The patient has not been seen through this office since 2016.  The patient had EMG nerve conduction study of the lower extremities at that time, no evidence of a peripheral neuropathy was seen.  The patient reports a history of some troubles with gait instability since she was 63 years of age.  She has had some mild worsening of her gait instability over the years, she has fallen twice within the last day or 2.  She reports no back pain or neck pain, she reports no numbness or discomfort down the arms or legs.  She feels at times that the legs are slightly weak, she does not have any weakness of the arms.  She  also reports some troubles with memory over the last 4 or 5 years.  She has word finding problems and difficulty with spelling and difficulty remembering names for people.  She is able to keep up with her medications and appointments, she is able to operate a motor vehicle without difficulty.  She does report a significant issue with motion sickness that has been present for many years.  She does not use a cane for ambulation.  She has a vagal nerve stimulator in place, but she no longer feels the cycling of the stimulator.  She has had a recent vitamin B12 level checked, the patient does not know the results of this study.  The patient claims that her sleeping habits are irregular, but in general she is getting enough sleep.  REVIEW OF SYSTEMS: Out of a complete 14 system review of symptoms, the patient complains only of the following symptoms, and all other reviewed systems are negative.  Memory loss, gait instability  ALLERGIES: Allergies  Allergen Reactions  . Elavil [Amitriptyline Hcl] Nausea And Vomiting, Nausea Only and Other (See Comments)    Dizziness;crying  . Wellbutrin [Bupropion Hcl] Other (See Comments)    Confusion    HOME MEDICATIONS: Outpatient Medications Prior to Visit  Medication Sig Dispense Refill  . Acetaminophen (TYLENOL PO) Take by mouth.    . busPIRone (BUSPAR) 30 MG tablet Take 1 tablet (30 mg total) by mouth 2 (two) times daily. 60 tablet 3  .  caffeine 200 MG TABS tablet Take 200 mg by mouth every 4 (four) hours as needed.    Marland Kitchen. FLUoxetine (PROZAC) 20 MG capsule Take 3 capsules (60 mg total) by mouth daily. 90 capsule 3  . gabapentin (NEURONTIN) 300 MG capsule Take 300 mg by mouth 2 (two) times daily.    Marland Kitchen. lamoTRIgine (LAMICTAL) 200 MG tablet Take 1 tablet (200 mg total) by mouth daily. Take 1/2 tablet in the morning and 1 tablet in the evening 45 tablet 3  . meclizine (ANTIVERT) 25 MG tablet Take 25 mg by mouth 3 (three) times daily as needed for dizziness.    Marland Kitchen.  omeprazole (PRILOSEC) 40 MG capsule Take 40 mg by mouth 2 (two) times daily.    Marland Kitchen. rOPINIRole (REQUIP) 0.25 MG tablet Take 0.5 mg by mouth at bedtime.     . traMADol (ULTRAM) 50 MG tablet Take by mouth every 6 (six) hours as needed.     No facility-administered medications prior to visit.     PAST MEDICAL HISTORY: Past Medical History:  Diagnosis Date  . Anemia   . Anxiety   . Depression   . Fibromyalgia   . High cholesterol   . Mildly obese   . Myalgia and myositis 01/02/2014  . Restless legs syndrome (RLS) 12/09/2013  . Tremor 09/09/2013    PAST SURGICAL HISTORY: Past Surgical History:  Procedure Laterality Date  . COSMETIC SURGERY  1976  . dermoid cyst removal  1990  . OOPHORECTOMY    . vagal nerve stimulator placement Left     FAMILY HISTORY: Family History  Problem Relation Age of Onset  . Heart disease Mother        enlarged heart  . Hypertension Mother   . Cancer Mother        ovarian  . Cancer Father        Prostate/skin  . Diabetes Father        type 2  . Alzheimer's disease Father   . Cancer Maternal Grandfather        prostate   . Breast cancer Paternal Aunt   . Tremor Neg Hx     SOCIAL HISTORY: Social History   Socioeconomic History  . Marital status: Single    Spouse name: Not on file  . Number of children: 0  . Years of education: college  . Highest education level: Master's degree (e.g., MA, MS, MEng, MEd, MSW, MBA)  Occupational History  . Occupation: unemployed    Employer: DISABLED  Social Needs  . Financial resource strain: Not on file  . Food insecurity    Worry: Not on file    Inability: Not on file  . Transportation needs    Medical: Not on file    Non-medical: Not on file  Tobacco Use  . Smoking status: Never Smoker  . Smokeless tobacco: Never Used  Substance and Sexual Activity  . Alcohol use: Yes    Comment: rarely in small amounts  . Drug use: No  . Sexual activity: Not on file  Lifestyle  . Physical activity    Days  per week: Not on file    Minutes per session: Not on file  . Stress: Not on file  Relationships  . Social Musicianconnections    Talks on phone: Not on file    Gets together: Not on file    Attends religious service: Not on file    Active member of club or organization: Not on file    Attends  meetings of clubs or organizations: Not on file    Relationship status: Not on file  . Intimate partner violence    Fear of current or ex partner: Not on file    Emotionally abused: Not on file    Physically abused: Not on file    Forced sexual activity: Not on file  Other Topics Concern  . Not on file  Social History Narrative   Patient is single and lives alone.   Patient does not have any children.   Patient is on disability.   Patient is right-handed.   Patient take Jet Alert, which has caffeine.   Exercise - 3 times /wk for 3 hours doing water aerobics   100-200 mg of caffeine daily (takes pills)       PHYSICAL EXAM  There were no vitals filed for this visit. There is no height or weight on file to calculate BMI.  Generalized: Well developed, in no acute distress  MMSE - Mini Mental State Exam 12/16/2018 06/11/2018  Orientation to time 5 5  Orientation to Place 5 5  Registration 3 3  Attention/ Calculation 5 5  Recall 3 3  Language- name 2 objects 2 2  Language- repeat 1 1  Language- follow 3 step command 3 3  Language- read & follow direction 1 1  Write a sentence 1 1  Copy design 1 1  Copy design-comments named 13 animals -  Total score 30 30    Neurological examination  Mentation: Alert oriented to time, place, history taking. Follows all commands speech and language fluent Cranial nerve II-XII: Pupils were equal round reactive to light. Extraocular movements were full, visual field were full on confrontational test. Facial sensation and strength were normal.  Head turning and shoulder shrug  were normal and symmetric. Motor: The motor testing reveals 5 over 5 strength of all  4 extremities. Good symmetric motor tone is noted throughout.  Mildly fidgety, mild vocalizations noted. Sensory: Sensory testing is intact to soft touch on all 4 extremities. No evidence of extinction is noted.  Coordination: Cerebellar testing reveals good finger-nose-finger and heel-to-shin bilaterally.  Gait and station: Gait is normal. Tandem gait is mildly unsteady. Romberg is negative. No drift is seen.  Reflexes: Deep tendon reflexes are symmetric and normal bilaterally.   DIAGNOSTIC DATA (LABS, IMAGING, TESTING) - I reviewed patient records, labs, notes, testing and imaging myself where available.  Lab Results  Component Value Date   WBC 6.4 11/13/2011   HGB 13.7 11/13/2011   HCT 45.6 11/13/2011   MCV 101.7 (A) 11/13/2011   PLT 288 10/08/2009      Component Value Date/Time   NA 141 11/13/2011 1725   K 4.8 11/13/2011 1725   CL 103 11/13/2011 1725   CO2 28 11/13/2011 1725   GLUCOSE 81 11/13/2011 1725   BUN 17 11/13/2011 1725   CREATININE 0.99 11/13/2011 1725   CALCIUM 10.0 11/13/2011 1725   PROT 7.1 09/15/2011 1303   ALBUMIN 4.3 09/15/2011 1303   AST 18 09/15/2011 1303   ALT 16 09/15/2011 1303   ALKPHOS 103 09/15/2011 1303   BILITOT 0.5 09/15/2011 1303   Lab Results  Component Value Date   CHOL 198 09/15/2011   HDL 60 09/15/2011   LDLCALC 127 (H) 09/15/2011   TRIG 54 09/15/2011   CHOLHDL 3.3 09/15/2011   No results found for: HGBA1C Lab Results  Component Value Date   VITAMINB12 622 01/02/2014   Lab Results  Component Value Date  TSH 1.820 09/09/2013    ASSESSMENT AND PLAN 63 y.o. year old female  has a past medical history of Anemia, Anxiety, Depression, Fibromyalgia, High cholesterol, Mildly obese, Myalgia and myositis (01/02/2014), Restless legs syndrome (RLS) (12/09/2013), and Tremor (09/09/2013). here with:  1.  Gait instability 2.  Reported memory disturbance 3.  History of depression, history of vagal nerve stimulator, is nonfunctional  Her memory  score is perfect today, 30/30. I will reorder physical therapy for gait training.  MRI of the brain was ordered at last visit to evaluate memory disturbance.  She has vagal nerve stimulator that is nonfunctional. MRI of the brain was not done because they were unable to determine if device is MRI compatible.  She will review her paperwork, if she is unable to find adequate documentation, I will order CT scan of the brain.  During exam, she is somewhat fidgety and displays mild vocalizations. Dr. 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  She will follow-up in 6 months or sooner if needed.  I did advise if her symptoms worsen or if she develops any new symptoms she should let us know.  I spent 25 minutes with the patient. 50% of this time was spent discussing her plan of care.   Butler Denmark, AGNP-C, DNP 12/16/2018, 3:28 PM [facility] [address], [place], [place] [address] [phone]

## 2018-12-16 NOTE — Patient Instructions (Addendum)
1. We will get MRI of the brain, please try to determine model #, MRI compatible? 2. I will place referral for physical therapy 3. Return in 6 months

## 2018-12-16 NOTE — Progress Notes (Signed)
I have read the note, and I agree with the clinical assessment and plan.  Brandi Mora K Brandi Mora   

## 2019-01-09 ENCOUNTER — Encounter: Payer: Self-pay | Admitting: Rehabilitative and Restorative Service Providers"

## 2019-01-09 ENCOUNTER — Ambulatory Visit: Payer: Medicare Other | Attending: Neurology | Admitting: Rehabilitative and Restorative Service Providers"

## 2019-01-09 ENCOUNTER — Other Ambulatory Visit: Payer: Self-pay

## 2019-01-09 DIAGNOSIS — M6281 Muscle weakness (generalized): Secondary | ICD-10-CM | POA: Insufficient documentation

## 2019-01-09 DIAGNOSIS — R2689 Other abnormalities of gait and mobility: Secondary | ICD-10-CM

## 2019-01-09 DIAGNOSIS — R293 Abnormal posture: Secondary | ICD-10-CM | POA: Insufficient documentation

## 2019-01-09 NOTE — Therapy (Signed)
[facility] [address] [place], [place], [address] Phone: [phone]   Fax:  [phone]  Physical Therapy Treatment  Patient Details  Name: Brandi Mora MRN: [id number] Date of Birth: March 07, 1956 Referring Provider (PT): Butler Denmark, NP   Encounter Date: 01/09/2019  PT End of Session - 01/09/19 1844    Visit Number  1    Number of Visits  4    Date for PT Re-Evaluation  02/08/19    Authorization Type  medicare/medicaid    PT Start Time  1745    PT Stop Time  1830    PT Time Calculation (min)  45 min    Activity Tolerance  Patient tolerated treatment well    Behavior During Therapy  [facility] for tasks assessed/performed       Past Medical History:  Diagnosis Date  . Anemia   . Anxiety   . Depression   . Fibromyalgia   . High cholesterol   . Mildly obese   . Myalgia and myositis 01/02/2014  . Restless legs syndrome (RLS) 12/09/2013  . Tremor 09/09/2013    Past Surgical History:  Procedure Laterality Date  . COSMETIC SURGERY  1976  . dermoid cyst removal  1990  . OOPHORECTOMY    . vagal nerve stimulator placement Left     There were no vitals filed for this visit.  Subjective Assessment - 01/09/19 1747    Subjective  The patient reports she does not feel completely steady and secure.  She notes she has to be more careful getting up at night.  "I could sometimes start losing my balance."  She reports no significant falls recently. She has had prior adverse reactions to meds that have increased unsteadiness, but that is not her current situation.  She has to stagger her medications to avoid nausea/vomitting.    Pertinent History  significant depression and fibromyalgia    Patient Stated Goals  The patient notes improving steadiness and being able to walk a straight line.    Currently in Pain?  No/denies   does note some neuropathy in hands        [facility] PT Assessment - 01/09/19 1751      Assessment    Medical Diagnosis  gait instability, memory disturbance    Referring Provider (PT)  Butler Denmark, NP    Onset Date/Surgical Date  12/16/18    Hand Dominance  Right    Prior Therapy  none      Precautions   Precautions  Fall    Precaution Comments  no recent falls      Restrictions   Weight Bearing Restrictions  No      Balance Screen   Has the patient fallen in the past 6 months  No    Has the patient had a decrease in activity level because of a fear of falling?   No    Is the patient reluctant to leave their home because of a fear of falling?   No      Home Environment   Living Environment  Private residence    Living Arrangements  Alone    Type of [place] to enter    Entrance Stairs-Number of Steps  3    Entrance Stairs-Rails  Can reach both    [place]  One level    [place] - single point      Prior Function  Level of Independence  Independent    Vocation  On disability      Cognition   Overall Cognitive Status  Impaired/Different from baseline   notes difficulty selecting words     Sensation   Light Touch  Impaired Detail    Light Touch Impaired Details  Impaired RUE;Impaired LUE   bilateral hand numbness intermittently   Additional Comments  h/o motion sickness worse lately where patient cannot watch dancing with the stars on TV, or watch PT demonstrate coordination RAM without getting nausea      Coordination   Gross Motor Movements are Fluid and Coordinated  --   rapid alternating movements equal bilaterally     Posture/Postural Control   Posture/Postural Control  Postural limitations    Postural Limitations  Forward head   extended trunk with shoulders posterior to hips, flexes head     ROM / Strength   AROM / PROM / Strength  AROM;Strength      AROM   Overall AROM   Within functional limits for tasks performed      Strength   Overall Strength  Within functional limits for tasks performed    Overall  Strength Comments  Bilateral UE strength is 5/5 for shoulder flexion and abduction, elbow flexion and extension. Bilateral hips 5/5, bilateral knee flexion and extension 5/5, bilateral ankle DF is 4/5.      Ambulation/Gait   Ambulation/Gait  Yes    Ambulation/Gait Assistance  7: Independent    Ambulation Distance (Feet)  200 Feet    Assistive device  None    Gait Pattern  Step-through pattern   abnormal posture   Ambulation Surface  Level;Indoor    Gait velocity  3.09 ft/sec    Stairs  Yes    Stairs Assistance  6: Modified independent (Device/Increase time)    Stair Management Technique  One rail Right;Alternating pattern      Standardized Balance Assessment   Standardized Balance Assessment  Berg Balance Test      Berg Balance Test   Sit to Stand  Able to stand without using hands and stabilize independently    Standing Unsupported  Able to stand safely 2 minutes    Sitting with Back Unsupported but Feet Supported on Floor or Stool  Able to sit safely and securely 2 minutes    Stand to Sit  Sits safely with minimal use of hands    Transfers  Able to transfer safely, minor use of hands    Standing Unsupported with Eyes Closed  Able to stand 10 seconds safely    Standing Unsupported with Feet Together  Able to place feet together independently and stand 1 minute safely    From Standing, Reach Forward with Outstretched Arm  Can reach confidently >25 cm (10")    From Standing Position, Pick up Object from Floor  Able to pick up shoe safely and easily    From Standing Position, Turn to Look Behind Over each Shoulder  Looks behind from both sides and weight shifts well    Turn 360 Degrees  Able to turn 360 degrees safely in 4 seconds or less   gets nausea   Standing Unsupported, Alternately Place Feet on Step/Stool  Able to stand independently and safely and complete 8 steps in 20 seconds    Standing Unsupported, One Foot in Front  Able to plae foot ahead of the other independently and  hold 30 seconds    Standing on One Leg  Unable to try  or needs assist to prevent fall    Total Score  51    Berg comment:  51/56      High Level Balance   High Level Balance Comments  self selected pace of VOR x 10 reps does not provoke dizziness today      Functional Gait  Assessment   Gait assessed   Yes    Gait Level Surface  Walks 20 ft in less than 7 sec but greater than 5.5 sec, uses assistive device, slower speed, mild gait deviations, or deviates 6-10 in outside of the 12 in walkway width.    Change in Gait Speed  Able to smoothly change walking speed without loss of balance or gait deviation. Deviate no more than 6 in outside of the 12 in walkway width.    Gait with Horizontal Head Turns  Performs head turns smoothly with slight change in gait velocity (eg, minor disruption to smooth gait path), deviates 6-10 in outside 12 in walkway width, or uses an assistive device.    Gait with Vertical Head Turns  Performs head turns with no change in gait. Deviates no more than 6 in outside 12 in walkway width.    Gait and Pivot Turn  Pivot turns safely within 3 sec and stops quickly with no loss of balance.    Step Over Obstacle  Is able to step over one shoe box (4.5 in total height) without changing gait speed. No evidence of imbalance.    Gait with Narrow Base of Support  Ambulates less than 4 steps heel to toe or cannot perform without assistance.    Gait with Eyes Closed  Walks 20 ft, slow speed, abnormal gait pattern, evidence for imbalance, deviates 10-15 in outside 12 in walkway width. Requires more than 9 sec to ambulate 20 ft.    Ambulating Backwards  Walks 20 ft, slow speed, abnormal gait pattern, evidence for imbalance, deviates 10-15 in outside 12 in walkway width.    Steps  Alternating feet, must use rail.    Total Score  19    FGA comment:  19/30                           PT Education - 01/09/19 1840    Education Details  The patient and PT discussed plan  of care for therapy    Person(s) Educated  Patient    Methods  Explanation    Comprehension  Verbalized understanding          PT Long Term Goals - 01/09/19 1845      PT LONG TERM GOAL #1   Title  The patient will be independent with HEP for strengthening and high level balance.    Time  4    Period  Weeks    Target Date  02/08/19      PT LONG TERM GOAL #2   Title  The patient will improve FGA from 19/30 to > or equal to 24/30 to demonstrate dec'ing risk for falls.    Time  4    Period  Weeks    Target Date  02/08/19      PT LONG TERM GOAL #3   Title  The patient will maintain single leg stance x 4 seconds bilaterally to demonstrate improving single leg stance control for functional tasks.    Time  4    Period  Weeks    Target Date  02/08/19  PT LONG TERM GOAL #4   Title  The patient will demonstrate improved core stability per ability to return demo postural alignment with shoulders over hips (instead of posterior to hips).    Time  4    Period  Weeks    Target Date  02/08/19            Plan - 01/09/19 1824    Clinical Impression Statement  The patient is a 63 year old female presenting with ankle weakness, core weakness, abnormal posture, dec'd high level balance control, and decreased dynamic gait.  She also c/o motion sickness that is worsening that limits her abiity to tolerate exercise classes (has tried tai chi at community center). PT to address weakness and balance deficits while promoting improved general conditioning for ongoing wellness routine.    Personal Factors and Comorbidities  Comorbidity 2    Comorbidities  fibromyalgia, depression    Examination-Activity Limitations  Locomotion Level    Stability/Clinical Decision Making  Stable/Uncomplicated    Clinical Decision Making  Low    Rehab Potential  Good    PT Frequency  1x / week    PT Duration  4 weeks    PT Treatment/Interventions  ADLs/Self Care Home Management;Neuromuscular  re-education;Patient/family education;Gait training;Stair training;Functional mobility training;Therapeutic exercise;Therapeutic activities;Balance training;Manual techniques;Vestibular    PT Next Visit Plan  HEP:  ankle DF and PF, core strengthening, single leg stance, tandem stance.  Walking program (PT recommended she begin 1 mile level surfaces x 3-4 days/week).  Use walking and turns for habituation of motion sensitivity as indicated.    Consulted and Agree with Plan of Care  Patient       Patient will benefit from skilled therapeutic intervention in order to improve the following deficits and impairments:  Abnormal gait, Decreased balance, Postural dysfunction, Decreased strength  Visit Diagnosis: Other abnormalities of gait and mobility  Muscle weakness (generalized)  Abnormal posture     Problem List Patient Active Problem List   Diagnosis Date Noted  . Gait abnormality 12/16/2018  . Complaints of memory disturbance 12/16/2018  . Myalgia and myositis 01/02/2014  . Restless legs syndrome (RLS) 12/09/2013  . Tremor 09/09/2013  . Scoliosis 09/15/2011  . 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 HYPERLIPIDEMIA 03/08/2010  . HERPES ZOSTER 02/28/2010  . VAGINAL BLEEDING, ABNORMAL 12/03/2009  . LOOSE STOOLS 12/03/2009  . UNSPECIFIED URINARY INCONTINENCE 12/03/2009  . WEIGHT GAIN 10/08/2009  . DRY SKIN 10/15/2008  . INSOMNIA 10/15/2008  . WEIGHT LOSS, RECENT 10/15/2008  . VAGINAL PRURITUS 07/31/2008  . SKIN TAG 06/26/2008  . SEBORRHEIC KERATOSIS 06/26/2008  . PRURITUS 05/28/2008  . FATIGUE, CHRONIC 05/28/2008  . DISTURBANCES OF SENSATION OF SMELL AND TASTE 05/28/2008  . MASS, CHEST WALL 05/28/2008  . ANEMIA-NOS 05/15/2008  . ANXIETY 05/15/2008  . DEPRESSION 05/15/2008    Sharnee Douglass , PT 01/09/2019, 6:55 PM  [facility] [address] [place], [place], [address] Phone: [phone]   Fax:   [phone]  Name: Brandi Mora MRN: [id number] Date of Birth: 1955-08-08

## 2019-01-15 ENCOUNTER — Other Ambulatory Visit: Payer: Self-pay

## 2019-01-15 ENCOUNTER — Ambulatory Visit: Payer: Medicare Other

## 2019-01-15 DIAGNOSIS — R2689 Other abnormalities of gait and mobility: Secondary | ICD-10-CM | POA: Diagnosis not present

## 2019-01-15 DIAGNOSIS — M6281 Muscle weakness (generalized): Secondary | ICD-10-CM

## 2019-01-15 DIAGNOSIS — R293 Abnormal posture: Secondary | ICD-10-CM

## 2019-01-15 NOTE — Therapy (Signed)
[facility] [address] [place], [place], [address] Phone: [phone]   Fax:  [phone]  Physical Therapy Treatment  Patient Details  Name: Brandi Mora MRN: [id number] Date of Birth: February 26, 1956 Referring Provider (PT): Margie EgeSarah Slack, NP   Encounter Date: 01/15/2019  PT End of Session - 01/15/19 1659    Visit Number  2    Number of Visits  4    Date for PT Re-Evaluation  02/08/19    Authorization Type  medicare/medicaid    PT Start Time  1621    PT Stop Time  1700    PT Time Calculation (min)  39 min    Equipment Utilized During Treatment  Gait belt    Activity Tolerance  Patient tolerated treatment well    Behavior During Therapy  [facility] for tasks assessed/performed       Past Medical History:  Diagnosis Date  . Anemia   . Anxiety   . Depression   . Fibromyalgia   . High cholesterol   . Mildly obese   . Myalgia and myositis 01/02/2014  . Restless legs syndrome (RLS) 12/09/2013  . Tremor 09/09/2013    Past Surgical History:  Procedure Laterality Date  . COSMETIC SURGERY  1976  . dermoid cyst removal  1990  . OOPHORECTOMY    . vagal nerve stimulator placement Left     There were no vitals filed for this visit.  Subjective Assessment - 01/15/19 1622    Subjective  Pt reports that she is doing well today but does have issues still with nausea with motion issues that she has had for some time.    Pertinent History  significant depression and fibromyalgia    Patient Stated Goals  The patient notes improving steadiness and being able to walk a straight line.    Currently in Pain?  No/denies                       [facility] - 01/15/19 1654      Neuro Re-ed    Neuro Re-ed Details   Standing on blue foam feet together without UE support eyes open and eyes closed x 30 sec with close SBA. Pt very unsteady with eyes closed losing balance multiple times needing min  assist to regain. SLS on floor with allowing patient to steady herself with UE support at first then let go with multiple trials. Pt able to hold 3-8 sec but was variable. Tandem stance 30 sec x 2 each position. Marching gait 20' x 4 close SBA with verbal cues to control movement and take time during SLS time. Side stepping 20' x 2 with verbal cues to pick up feet.      Exercises   Exercises  Lumbar    Other Exercises   Standing at counter raising up on toes and back on heel x 10, standing on airex raising up on toes and back on heels x 10.      Lumbar Exercises: Seated   Hip Flexion on Ball  5 reps    Hip Flexion on Ball Limitations  alternating with cues to brace abdomen during movements. Min assist of therapist to help stabilize ball as patient had trouble maintaining balance with lifrting legs.    Other Seated Lumbar Exercises  Sitting on theraball alternating arm raises x 10 with verbal cuing to brace abdomen.      Lumbar Exercises: Supine   Bent Knee Raise  10 reps    Bent Knee Raise Limitations  Verbal cues for abdominal bracing    Dead Bug  5 reps    Dead Bug Limitations  difficulty coordinating movements             PT Education - 01/15/19 1831    Education Details  Pt was given initial HEP for core strengthening, ankle and balance. Educated on turning lights on when gets up at night.    Person(s) Educated  Patient    Methods  Explanation;Demonstration;Handout    Comprehension  Verbalized understanding          PT Long Term Goals - 01/09/19 1845      PT LONG TERM GOAL #1   Title  The patient will be independent with HEP for strengthening and high level balance.    Time  4    Period  Weeks    Target Date  02/08/19      PT LONG TERM GOAL #2   Title  The patient will improve FGA from 19/30 to > or equal to 24/30 to demonstrate dec'ing risk for falls.    Time  4    Period  Weeks    Target Date  02/08/19      PT LONG TERM GOAL #3   Title  The patient will  maintain single leg stance x 4 seconds bilaterally to demonstrate improving single leg stance control for functional tasks.    Time  4    Period  Weeks    Target Date  02/08/19      PT LONG TERM GOAL #4   Title  The patient will demonstrate improved core stability per ability to return demo postural alignment with shoulders over hips (instead of posterior to hips).    Time  4    Period  Weeks    Target Date  02/08/19            Plan - 01/15/19 1832    Clinical Impression Statement  Pt was initially complaining of some nausea with balance activities on airex. PT had her focus on target on wall and seemed to help some. No further complaints during session. Pt was challenged with SLS activities and dynamic gait activities. Instructed to focus more on tightening gluts during stance time and core to help. Pt seems to rely heavily on vision component of balance.    Personal Factors and Comorbidities  Comorbidity 2    Comorbidities  fibromyalgia, depression    Examination-Activity Limitations  Locomotion Level    Stability/Clinical Decision Making  Stable/Uncomplicated    Rehab Potential  Good    PT Frequency  1x / week    PT Duration  4 weeks    PT Treatment/Interventions  ADLs/Self Care Home Management;Neuromuscular re-education;Patient/family education;Gait training;Stair training;Functional mobility training;Therapeutic exercise;Therapeutic activities;Balance training;Manual techniques;Vestibular    PT Next Visit Plan  See how initial HEP has started. Continue with balance exercise progression with SLS activities, tandem and encouraging walking program. Use walking and turns for habituation of motion sensitivity as indicated. Activities to engage core for more stability.    Consulted and Agree with Plan of Care  Patient       Patient will benefit from skilled therapeutic intervention in order to improve the following deficits and impairments:  Abnormal gait, Decreased balance, Postural  dysfunction, Decreased strength  Visit Diagnosis: Other abnormalities of gait and mobility  Muscle weakness (generalized)  Abnormal posture     Problem List Patient Active Problem List  Diagnosis Date Noted  . Gait abnormality 12/16/2018  . Complaints of memory disturbance 12/16/2018  . Myalgia and myositis 01/02/2014  . Restless legs syndrome (RLS) 12/09/2013  . Tremor 09/09/2013  . Scoliosis 09/15/2011  . 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 HYPERLIPIDEMIA 03/08/2010  . HERPES ZOSTER 02/28/2010  . VAGINAL BLEEDING, ABNORMAL 12/03/2009  . LOOSE STOOLS 12/03/2009  . UNSPECIFIED URINARY INCONTINENCE 12/03/2009  . WEIGHT GAIN 10/08/2009  . DRY SKIN 10/15/2008  . INSOMNIA 10/15/2008  . WEIGHT LOSS, RECENT 10/15/2008  . VAGINAL PRURITUS 07/31/2008  . SKIN TAG 06/26/2008  . SEBORRHEIC KERATOSIS 06/26/2008  . PRURITUS 05/28/2008  . FATIGUE, CHRONIC 05/28/2008  . DISTURBANCES OF SENSATION OF SMELL AND TASTE 05/28/2008  . MASS, CHEST WALL 05/28/2008  . ANEMIA-NOS 05/15/2008  . ANXIETY 05/15/2008  . DEPRESSION 05/15/2008    Ronn Melena, PT, DPT, NCS 01/15/2019, 6:37 PM  [facility] [address] [place], [place], [address] Phone: [phone]   Fax:  [phone]  Name: Brandi Mora MRN: [id number] Date of Birth: 1956-03-18

## 2019-01-15 NOTE — Patient Instructions (Signed)
Access Code: [email or web address]     URL: [email or web address]    Date: 01/15/2019  Prepared by: Dillon Livermore   Exercises Single Leg Stance - 3 reps - 1 sets - 30 sec hold - 2x daily - 7x weekly Tandem Stance - 3 reps - 1 sets - 30 sec hold - 2x daily - 7x weekly Heel rises with counter support - 10 reps - 3 sets - 1x daily - 7x weekly Standing Toe Raises at Chair - 10 reps - 3 sets - 1x daily - 7x weekly Supine March with Posterior Pelvic Tilt - 10 reps - 2 sets - 1x daily- 7x weekly                                                                                                                                                                                                                                                                                 -  

## 2019-01-20 ENCOUNTER — Telehealth: Payer: Self-pay | Admitting: Neurology

## 2019-01-20 DIAGNOSIS — R413 Other amnesia: Secondary | ICD-10-CM

## 2019-01-20 NOTE — Telephone Encounter (Signed)
Phone rep checked office voicemail; at 3:03pm RN [place] @ Dr Beverly Gust office called to inform pt is unable to find any other information on her VNS.  Pt is ready to be scheduled for her CT of brain.  Please call RN [place]

## 2019-01-21 ENCOUNTER — Telehealth: Payer: Self-pay | Admitting: Neurology

## 2019-01-21 NOTE — Telephone Encounter (Signed)
I will order CT scan of the brain since she has VNS that we are unable to determine if MRI compatible.

## 2019-01-21 NOTE — Telephone Encounter (Signed)
[employer] medicare/medicaid order sent to [facility]. No auth they will reach out to the patient to schedule.  °

## 2019-01-21 NOTE — Addendum Note (Signed)
Addended by: Suzzanne Cloud on: 01/21/2019 02:39 PM   Modules accepted: Orders

## 2019-01-21 NOTE — Telephone Encounter (Signed)
I called and clarified per brittancy at dr. Beverly Gust' office.  That she was not able to locate any other information about her VNS.  Ok to order CT brain.

## 2019-01-24 ENCOUNTER — Other Ambulatory Visit: Payer: Self-pay

## 2019-01-24 ENCOUNTER — Ambulatory Visit: Payer: Medicare Other

## 2019-01-24 DIAGNOSIS — R293 Abnormal posture: Secondary | ICD-10-CM

## 2019-01-24 DIAGNOSIS — R2689 Other abnormalities of gait and mobility: Secondary | ICD-10-CM | POA: Diagnosis not present

## 2019-01-24 DIAGNOSIS — M6281 Muscle weakness (generalized): Secondary | ICD-10-CM

## 2019-01-24 NOTE — Therapy (Signed)
[facility] [address] [place], [place], [address] Phone: [phone]   Fax:  [phone]  Physical Therapy Treatment  Patient Details  Name: Brandi Mora MRN: [id number] Date of Birth: 1955-10-25 Referring Provider (PT): Margie Ege, NP   Encounter Date: 01/24/2019  PT End of Session - 01/24/19 1547    Visit Number  3    Number of Visits  4    Date for PT Re-Evaluation  02/08/19    Authorization Type  medicare/medicaid    PT Start Time  1541    PT Stop Time  1620    PT Time Calculation (min)  39 min    Equipment Utilized During Treatment  Other (comment)   min guard to S prn   Activity Tolerance  Patient tolerated treatment well    Behavior During Therapy  [facility] for tasks assessed/performed       Past Medical History:  Diagnosis Date  . Anemia   . Anxiety   . Depression   . Fibromyalgia   . High cholesterol   . Mildly obese   . Myalgia and myositis 01/02/2014  . Restless legs syndrome (RLS) 12/09/2013  . Tremor 09/09/2013    Past Surgical History:  Procedure Laterality Date  . COSMETIC SURGERY  1976  . dermoid cyst removal  1990  . OOPHORECTOMY    . vagal nerve stimulator placement Left     There were no vitals filed for this visit.  Subjective Assessment - 01/24/19 1543    Subjective  Pt reported RLE pain that began a few weeks ago and said she's not feeling great right now. Pt arrived 10 minutes late to appt. Pt still has nausea with motion. Pt has not performed HEP much since last visit. At 1605 into appt, pt reported she does not feel that meclizine and zofran are not helping dizziness or nausea.    Pertinent History  significant depression and fibromyalgia    Patient Stated Goals  The patient notes improving steadiness and being able to walk a straight line.    Currently in Pain?  No/denies       Neuro re-ed and therex: Access Code: [email or web address]     URL: [email or web address]     Date: 01/15/2019  Prepared by: Elmer Bales   Exercises Single Leg Stance - 3 reps - 1 sets - 30 sec hold - 2x daily - 7x weekly Tandem Stance - 3 reps - 1 sets - 30 sec hold - 2x daily - 7x weekly Heel rises with counter support - 10 reps - 3 sets - 1x daily - 7x weekly  Standing Toe Raises at Chair - 10 reps - 3 sets - 1x daily - 7x weekly  Supine March with Posterior Pelvic Tilt - 10 reps - 2 sets - 1x daily- 7x weekly (therex)  Cues and demo for technique and S with one UE support for safety.                                                                                                                                                                                                                                                                                 -  Vestibular Assessment - 01/24/19 1613      Symptom Behavior   Subjective history of current problem  Pt reported wooziness began years ago and she's always had motion sensitvity but it has gotten worse.    Type of Dizziness   --   wooziness   Frequency of Dizziness  Intermittent    Duration of Dizziness  Minutes to hours    Symptom Nature  Motion provoked;Intermittent    Aggravating Factors  Comment   watching movement, turning head while in car   Relieving Factors  Rest;Slow movements    Progression of Symptoms  Worse      Oculomotor Exam   Oculomotor Alignment  Normal    Spontaneous  Absent    Gaze-induced   Absent    Smooth Pursuits  Intact    Saccades  Intact    Comment  Pt reported slight dizziness with saccades (felt like she was moving)      Vestibulo-Ocular Reflex   VOR 1 Head Only (x 1 viewing)  Pt reported incr. wooziness 5/10 (horizontal) and 6/10 vertical.    VOR Cancellation  Unable to maintain gaze   pt reports this is "harder"              [facility] Adult PT Treatment/Exercise - 01/24/19 1639      High Level Balance   High Level Balance Activities  Head turns   and head nods    High Level Balance Comments  Performed at counter with intermittent one UE support and S for safety. Cues and demo for technique.       Vestibular Treatment/Exercise - 01/24/19 1640      Vestibular Treatment/Exercise   Vestibular Treatment Provided  Gaze    Gaze Exercises  X1 Viewing Horizontal;X1 Viewing Vertical      X1 Viewing Horizontal   Foot Position  seated    Time  --   30 sec.   Reps  2    Comments  Pt required cues and demo, as she neglected to perform L Cx rotation equal to R Cx rotation.       X1 Viewing Vertical   Foot Position  seated    Time  --   30 sec.    Reps  2    Comments  Cues and demo for technique.             PT Education - 01/24/19 1547    Education Details  PT reviewed and added to HEP. PT also encouraged pt to walk at least twice within the next week (one mile) per primary PT's suggestion upon eval. PT began vestibular exam and will finish next session and will provide HEP as indicated.    Person(s) Educated  Patient    Methods  Explanation;Demonstration;Tactile cues;Verbal cues;Handout    Comprehension  Returned demonstration;Verbalized understanding          PT Long Term Goals - 01/09/19 1845      PT LONG TERM GOAL #1   Title  The patient will be independent with HEP for strengthening and high level balance.    Time  4    Period  Weeks    Target Date  02/08/19      PT LONG TERM GOAL #2   Title  The patient will improve FGA from 19/30 to > or equal to 24/30 to demonstrate dec'ing risk for falls.    Time  4    Period  Weeks    Target Date  02/08/19      PT LONG TERM GOAL #3   Title  The patient will maintain single leg stance x 4 seconds bilaterally to demonstrate improving single leg stance control for functional tasks.    Time  4    Period  Weeks    Target Date  02/08/19      PT LONG TERM GOAL #4   Title  The patient will demonstrate improved core stability per ability to return demo postural alignment with shoulders over  hips (instead of posterior to hips).    Time  4    Period  Weeks    Target Date  02/08/19            Plan - 01/24/19 1636    Clinical Impression Statement  Today's skilled session focused on reviewing HEP as pt reported she did not perform consistently since last visit and was unable to verbalize understanding until reviewed. PT also began vestibular exam as pt reported wooziness/dizziness is becoming worse,PT will complete next session 2/2 time constraints. Preliminary exam indicates that pt's dizziness is likely multi-factorial in nature and would benefit from vestibular treatment to improve dizziness. Continue with POC.    Personal Factors and Comorbidities  Comorbidity 2    Comorbidities  fibromyalgia, depression    Examination-Activity Limitations  Locomotion Level    Stability/Clinical Decision Making  Stable/Uncomplicated    Rehab Potential  Good    PT Frequency  1x / week    PT Duration  4 weeks    PT Treatment/Interventions  ADLs/Self Care Home Management;Neuromuscular re-education;Patient/family education;Gait training;Stair training;Functional mobility training;Therapeutic exercise;Therapeutic activities;Balance training;Manual techniques;Vestibular    PT Next Visit Plan  Complete vestibular exam: work on gait with head turns/nods. Continue with balance exercise progression with SLS activities, tandem and encouraging walking program. Use walking and turns for habituation of motion sensitivity as indicated. Activities to engage core for more stability.    Consulted and Agree with Plan of Care  Patient       Patient will benefit from skilled therapeutic intervention in order to improve the following deficits and impairments:  Abnormal gait, Decreased balance, Postural dysfunction, Decreased strength  Visit Diagnosis: Other abnormalities of gait and mobility  Muscle weakness (generalized)  Abnormal posture     Problem List Patient Active Problem List   Diagnosis Date  Noted  . Gait abnormality 12/16/2018  . Complaints of memory disturbance 12/16/2018  . Myalgia and myositis 01/02/2014  . Restless legs syndrome (RLS) 12/09/2013  . Tremor 09/09/2013  . Scoliosis 09/15/2011  . 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 HYPERLIPIDEMIA 03/08/2010  . HERPES ZOSTER 02/28/2010  . VAGINAL BLEEDING, ABNORMAL 12/03/2009  . LOOSE STOOLS 12/03/2009  . UNSPECIFIED URINARY INCONTINENCE 12/03/2009  . WEIGHT GAIN 10/08/2009  . DRY SKIN 10/15/2008  . INSOMNIA 10/15/2008  . WEIGHT LOSS, RECENT 10/15/2008  . VAGINAL PRURITUS 07/31/2008  . SKIN TAG 06/26/2008  . SEBORRHEIC KERATOSIS 06/26/2008  . PRURITUS 05/28/2008  . FATIGUE, CHRONIC 05/28/2008  . DISTURBANCES OF SENSATION OF SMELL AND TASTE 05/28/2008  . MASS, CHEST WALL 05/28/2008  . ANEMIA-NOS 05/15/2008  . ANXIETY 05/15/2008  . DEPRESSION 05/15/2008    Everette Mall L 01/24/2019, 4:42 PM  [facility] [address] [place], [place], [address] Phone: [phone]   Fax:  [phone]  Name: Hulan Fessancy L Kassab MRN: [id number] Date of Birth: July 28, 1955  Zerita BoersJennifer Kegan Mckeithan, PT,DPT 01/24/19 4:43 PM Phone: [phone] Fax: [phone]

## 2019-01-24 NOTE — Patient Instructions (Addendum)
Access Code: [email or web address]     URL: [email or web address]    Date: 01/15/2019  Prepared by: Cherly Anderson   Exercises Single Leg Stance - 3 reps - 1 sets - 30 sec hold - 2x daily - 7x weekly Tandem Stance - 3 reps - 1 sets - 30 sec hold - 2x daily - 7x weekly Heel rises with counter support - 10 reps - 3 sets - 1x daily - 7x weekly Standing Toe Raises at Chair - 10 reps - 3 sets - 1x daily - 7x weekly Supine March with Posterior Pelvic Tilt - 10 reps - 2 sets - 1x daily- 7x weekly                                                                                                                                                                                                                                                                                 -

## 2019-01-29 ENCOUNTER — Other Ambulatory Visit: Payer: Self-pay

## 2019-01-29 ENCOUNTER — Ambulatory Visit: Payer: Medicare Other

## 2019-01-29 DIAGNOSIS — R2689 Other abnormalities of gait and mobility: Secondary | ICD-10-CM

## 2019-01-29 DIAGNOSIS — M6281 Muscle weakness (generalized): Secondary | ICD-10-CM

## 2019-01-29 DIAGNOSIS — R293 Abnormal posture: Secondary | ICD-10-CM

## 2019-01-30 ENCOUNTER — Other Ambulatory Visit: Payer: Medicare Other

## 2019-01-30 NOTE — Therapy (Signed)
[facility] [address] [place], [place], [address] Phone: [phone]   Fax:  [phone]  Physical Therapy Treatment  Patient Details  Name: Brandi Mora MRN: [id number] Date of Birth: Jan 26, 1956 Referring Provider (PT): Butler Denmark, NP   Encounter Date: 01/29/2019  PT End of Session - 01/29/19 1629    Visit Number  4    Number of Visits  5    Date for PT Re-Evaluation  02/08/19    Authorization Type  medicare/medicaid    PT Start Time  1620    PT Stop Time  1654    PT Time Calculation (min)  34 min    Equipment Utilized During Treatment  Other (comment)   min guard to S prn   Activity Tolerance  Patient tolerated treatment well    Behavior During Therapy  [facility] for tasks assessed/performed       Past Medical History:  Diagnosis Date  . Anemia   . Anxiety   . Depression   . Fibromyalgia   . High cholesterol   . Mildly obese   . Myalgia and myositis 01/02/2014  . Restless legs syndrome (RLS) 12/09/2013  . Tremor 09/09/2013    Past Surgical History:  Procedure Laterality Date  . COSMETIC SURGERY  1976  . dermoid cyst removal  1990  . OOPHORECTOMY    . vagal nerve stimulator placement Left     There were no vitals filed for this visit.  Subjective Assessment - 01/29/19 1626    Subjective  Pt reports that after her last session she seemed to have more pain. Worst was in right hand. Does have history of neuropathy. Pt took something for nausea and vertigo before she came today. Pt reports that she continues to be bothered more with motion sensitivity. Pt reports she did go for 20 min walk yesterday.    Pertinent History  significant depression and fibromyalgia    Patient Stated Goals  The patient notes improving steadiness and being able to walk a straight line.    Currently in Pain?  Yes    Pain Location  Wrist    Pain Orientation  Right    Pain Descriptors / Indicators  --   dull pain   Pain Type   Acute pain;Chronic pain    Pain Onset  In the past 7 days    Pain Frequency  Intermittent    Pain Relieving Factors  tylenol         [facility] PT Assessment - 01/29/19 1634      Functional Gait  Assessment   Gait assessed   Yes    Gait Level Surface  Walks 20 ft in less than 5.5 sec, no assistive devices, good speed, no evidence for imbalance, normal gait pattern, deviates no more than 6 in outside of the 12 in walkway width.    Change in Gait Speed  Able to smoothly change walking speed without loss of balance or gait deviation. Deviate no more than 6 in outside of the 12 in walkway width.    Gait with Horizontal Head Turns  Performs head turns smoothly with slight change in gait velocity (eg, minor disruption to smooth gait path), deviates 6-10 in outside 12 in walkway width, or uses an assistive device.    Gait with Vertical Head Turns  Performs head turns with no change in gait. Deviates no more than 6 in outside 12 in walkway width.    Gait and Pivot Turn  Pivot turns safely within 3 sec and stops quickly with no loss of balance.    Step Over Obstacle  Is able to step over 2 stacked shoe boxes taped together (9 in total height) without changing gait speed. No evidence of imbalance.    Gait with Narrow Base of Support  Ambulates 4-7 steps.    Gait with Eyes Closed  Walks 20 ft, uses assistive device, slower speed, mild gait deviations, deviates 6-10 in outside 12 in walkway width. Ambulates 20 ft in less than 9 sec but greater than 7 sec.    Ambulating Backwards  Walks 20 ft, uses assistive device, slower speed, mild gait deviations, deviates 6-10 in outside 12 in walkway width.    Steps  Alternating feet, no rail.    Total Score  25                   [facility] Adult PT Treatment/Exercise - 01/29/19 1634      Ambulation/Gait   Ambulation/Gait  Yes    Ambulation/Gait Assistance  7: Independent    Ambulation Distance (Feet)  200 Feet    Assistive device  None    Gait Pattern   Step-through pattern      Neuro Re-ed    Neuro Re-ed Details   SLS > 10 sec each LE, tandem stance 30 sec each LE      Exercises   Exercises  Other Exercises    Other Exercises   Heel/toe raises x 20 with UE support, hooklying posterior pelvic tilt with march x 10. Pt needed verbal cuing for correct form.             PT Education - 01/30/19 16100634    Education Details  PT reviewed HEP and encouraged patient to perform as instructed as she had only walked 20 min 1x and not been working on other exercises.    Person(s) Educated  Patient    Methods  Explanation    Comprehension  Verbalized understanding;Returned demonstration          PT Long Term Goals - 01/29/19 1642      PT LONG TERM GOAL #1   Title  The patient will be independent with HEP for strengthening and high level balance.    Time  4    Period  Weeks    Status  On-going      PT LONG TERM GOAL #2   Title  The patient will improve FGA from 19/30 to > or equal to 24/30 to demonstrate dec'ing risk for falls.    Baseline  25/30 on FGA today meeting goal.    Time  4    Period  Weeks    Status  Achieved      PT LONG TERM GOAL #3   Title  The patient will maintain single leg stance x 4 seconds bilaterally to demonstrate improving single leg stance control for functional tasks.    Baseline  pt able to maintain SLS >10 each LE    Time  4    Period  Weeks    Status  Achieved      PT LONG TERM GOAL #4   Title  The patient will demonstrate improved core stability per ability to return demo postural alignment with shoulders over hips (instead of posterior to hips).    Baseline  pt needed cuing to brace abdominal muscles to improve alignment as shoulders still posterior to hips.    Time  4  Period  Weeks    Status  On-going            Plan - 01/30/19 0636    Clinical Impression Statement  Pt has shown improvement in balance measures meeting SLS goal and meeting FGA goals today. Pt has not been as compliant  with HEP and PT encouraged her to do so and reviewed again at session. Pt still feels limited by her motion sensitivity and did note some symptoms with some nausea with turning activities during FGA assessment.    Personal Factors and Comorbidities  Comorbidity 2    Comorbidities  fibromyalgia, depression    Examination-Activity Limitations  Locomotion Level    Stability/Clinical Decision Making  Stable/Uncomplicated    Rehab Potential  Good    PT Frequency  1x / week    PT Duration  4 weeks    PT Treatment/Interventions  ADLs/Self Care Home Management;Neuromuscular re-education;Patient/family education;Gait training;Stair training;Functional mobility training;Therapeutic exercise;Therapeutic activities;Balance training;Manual techniques;Vestibular    PT Next Visit Plan  Complete vestibular exam: work on gait with head turns/nods. Next visit will be recert if found has further vestibular needs. Continue with balance exercise progression with SLS activities, tandem and encouraging walking program. Use walking and turns for habituation of motion sensitivity as indicated. Activities to engage core for more stability.    Consulted and Agree with Plan of Care  Patient       Patient will benefit from skilled therapeutic intervention in order to improve the following deficits and impairments:  Abnormal gait, Decreased balance, Postural dysfunction, Decreased strength  Visit Diagnosis: Other abnormalities of gait and mobility  Muscle weakness (generalized)  Abnormal posture     Problem List Patient Active Problem List   Diagnosis Date Noted  . Gait abnormality 12/16/2018  . Complaints of memory disturbance 12/16/2018  . Myalgia and myositis 01/02/2014  . Restless legs syndrome (RLS) 12/09/2013  . Tremor 09/09/2013  . Scoliosis 09/15/2011  . 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 HYPERLIPIDEMIA 03/08/2010  . HERPES ZOSTER 02/28/2010  . VAGINAL BLEEDING, ABNORMAL 12/03/2009  . LOOSE  STOOLS 12/03/2009  . UNSPECIFIED URINARY INCONTINENCE 12/03/2009  . WEIGHT GAIN 10/08/2009  . DRY SKIN 10/15/2008  . INSOMNIA 10/15/2008  . WEIGHT LOSS, RECENT 10/15/2008  . VAGINAL PRURITUS 07/31/2008  . SKIN TAG 06/26/2008  . SEBORRHEIC KERATOSIS 06/26/2008  . PRURITUS 05/28/2008  . FATIGUE, CHRONIC 05/28/2008  . DISTURBANCES OF SENSATION OF SMELL AND TASTE 05/28/2008  . MASS, CHEST WALL 05/28/2008  . ANEMIA-NOS 05/15/2008  . ANXIETY 05/15/2008  . DEPRESSION 05/15/2008    Ronn Melena, PT, DPT, NCS 01/30/2019, 6:40 AM  [facility] [address] [place], [place], [address] Phone: [phone]   Fax:  [phone]  Name: Brandi Mora MRN: [id number] Date of Birth: 1956-01-18

## 2019-01-31 ENCOUNTER — Other Ambulatory Visit: Payer: Self-pay

## 2019-01-31 ENCOUNTER — Ambulatory Visit
Admission: RE | Admit: 2019-01-31 | Discharge: 2019-01-31 | Disposition: A | Discharge status: Home / Self Care | Payer: Medicare Other | Source: Ambulatory Visit | Attending: Neurology | Admitting: Neurology

## 2019-01-31 DIAGNOSIS — R413 Other amnesia: Secondary | ICD-10-CM

## 2019-02-03 ENCOUNTER — Telehealth: Payer: Self-pay | Admitting: *Deleted

## 2019-02-03 NOTE — Telephone Encounter (Signed)
-----   Message from Suzzanne Cloud, NP sent at 02/03/2019 10:07 AM EST ----- Please call the patient for CT scan of the brain. CT shows atrophy, with MILD progression from 2016. No acute reasons can be determined for memory loss.   IMPRESSION: This CT scan of the head without contrast shows the following: 1.   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  This is also noted in the CT scan from 2016 and there has been mild progression. 2.   No acute findings.

## 2019-02-03 NOTE — Telephone Encounter (Signed)
I called pt and relayed her results of MRI.  She was asking questions about for her age, where the atrophy in bilateral frontal lobes what that means.  I told I would have SS/NP call her and discuss on Wednesday.  She verbalized udnerstanding.

## 2019-02-04 ENCOUNTER — Other Ambulatory Visit: Payer: Self-pay

## 2019-02-04 ENCOUNTER — Ambulatory Visit: Payer: Medicare Other | Attending: Neurology

## 2019-02-04 ENCOUNTER — Ambulatory Visit: Payer: Medicare Other

## 2019-02-04 DIAGNOSIS — R42 Dizziness and giddiness: Secondary | ICD-10-CM | POA: Insufficient documentation

## 2019-02-04 DIAGNOSIS — R2689 Other abnormalities of gait and mobility: Secondary | ICD-10-CM

## 2019-02-05 ENCOUNTER — Ambulatory Visit: Payer: Medicare Other | Admitting: Physical Therapy

## 2019-02-05 NOTE — Telephone Encounter (Signed)
I called the patient, we reviewed her CT scan. She verbalized understanding.

## 2019-02-05 NOTE — Therapy (Signed)
[facility] [address] [place], [place], [address] Phone: [phone]   Fax:  [phone]  Physical Therapy Treatment  Patient Details  Name: Brandi Mora MRN: [id number] Date of Birth: Jul 04, 1955 Referring Provider (PT): Margie Ege, NP   Encounter Date: 02/04/2019  PT End of Session - 02/04/19 1652    Visit Number  5    Number of Visits  9    Date for PT Re-Evaluation  03/22/19    Authorization Type  medicare/medicaid    PT Start Time  1620    PT Stop Time  1652    PT Time Calculation (min)  32 min    Equipment Utilized During Treatment  Other (comment)   min guard to S prn   Activity Tolerance  Patient tolerated treatment well    Behavior During Therapy  [facility] for tasks assessed/performed       Past Medical History:  Diagnosis Date  . Anemia   . Anxiety   . Depression   . Fibromyalgia   . High cholesterol   . Mildly obese   . Myalgia and myositis 01/02/2014  . Restless legs syndrome (RLS) 12/09/2013  . Tremor 09/09/2013    Past Surgical History:  Procedure Laterality Date  . COSMETIC SURGERY  1976  . dermoid cyst removal  1990  . OOPHORECTOMY    . vagal nerve stimulator placement Left     There were no vitals filed for this visit.  Subjective Assessment - 02/04/19 1624    Subjective  Pt reports she is doing ok. Still having some pain in right wrist. Pt reports that she is still having some motion sensitivity. Reports that she finds it difficult to watch TV sometimes worse than others. She finds that it makes her feel nauseaous. The images are not slipping on the TV. She reports that at times even when she is moving. She is trying to walk more but has not been as routine as she would like.    Pertinent History  significant depression and fibromyalgia    Patient Stated Goals  The patient notes improving steadiness and being able to walk a straight line.    Currently in Pain?  Yes    Pain Score  6      Pain Location  Wrist    Pain Orientation  Right    Pain Descriptors / Indicators  Aching    Pain Type  Acute pain    Pain Onset  In the past 7 days         [facility] PT Assessment - 02/04/19 1642      Assessment   Medical Diagnosis  gait instability, memory disturbance    Referring Provider (PT)  Margie Ege, NP    Onset Date/Surgical Date  12/16/18         Vestibular Assessment - 02/04/19 1632      Symptom Behavior   Subjective history of current problem  Reports when she gets it she feels nausous and feels less steady when "dizziness" occurs. Denies any room spinning. Has been going on for some time.    Type of Dizziness   Comment   takes an hour or so to calm down   Frequency of Dizziness  daily but some days worse than others    Symptom Nature  Motion provoked    Aggravating Factors  Comment   movement including hands moving around, watching TV with thi   Relieving Factors  Closing eyes;Slow movements  Medication, takes meclizine and anti enemic medication   Progression of Symptoms  Worse      Oculomotor Exam   Oculomotor Alignment  Normal    Ocular ROM  WNL    Spontaneous  Absent    Gaze-induced   Absent    Smooth Pursuits  Intact    Saccades  Intact    Comment  Pt reported slight dizziness with saccades      Oculomotor Exam-Fixation Suppressed    Ocular ROM  WNL    Left Head Impulse  intact    Right Head Impulse  intact      Vestibulo-Ocular Reflex   VOR 1 Head Only (x 1 viewing)  intact with a little dizziness reported with horizontal head turns. Minimal dizziness with vertical head turns.    VOR Cancellation  Normal   intact but motion triggered some nausea.      Visual Acuity   Static  line 11    Dynamic  line 10               [facility] Adult PT Treatment/Exercise - 02/04/19 1642      Ambulation/Gait   Ambulation/Gait  Yes    Ambulation/Gait Assistance  7: Independent    Ambulation Distance (Feet)  230 Feet    Assistive device  None     Gait Pattern  Step-through pattern    Gait Comments  Pt ambulated 115' with tossing tennis ball up and down then 100' with PT handing tennis ball over shoulder and passing over other shoulder. Pt reported some  nausea with this activity.  Increased veering from path with given activities to do.              PT Education - 02/04/19 1716    Education Details  Pt instructed to continue with current HEP and walking program.    Person(s) Educated  Patient    Methods  Explanation    Comprehension  Verbalized understanding          PT Long Term Goals - 02/05/19 0740      PT LONG TERM GOAL #1   Title  The patient will be independent with HEP for strengthening, habituation exercises and high level balance.    Baseline  Pt has been progressing with her HEP but not performing as instructed. Has started to walk occasionally.    Time  4    Period  Weeks    Status  Revised    Target Date  03/22/19   4 weeks out from delayed start date due to scheduling     PT LONG TERM GOAL #2   Title  The patient will improve FGA from 19/30 to > or equal to 24/30 to demonstrate dec'ing risk for falls.    Baseline  25/30 on FGA today meeting goal.    Time  4    Period  Weeks    Status  Achieved      PT LONG TERM GOAL #3   Title  The patient will maintain single leg stance x 4 seconds bilaterally to demonstrate improving single leg stance control for functional tasks.    Baseline  pt able to maintain SLS >10 each LE    Time  4    Period  Weeks    Status  Achieved      PT LONG TERM GOAL #4   Title  The patient will demonstrate improved core stability per ability to return demo postural alignment with shoulders over hips (  instead of posterior to hips).    Baseline  pt needed cuing to brace abdominal muscles to improve alignment as shoulders still posterior to hips.    Time  4    Period  Weeks    Status  On-going    Target Date  03/22/19   4 weeks out from delayed start date due to delay in  scheduling     PT LONG TERM GOAL #5   Title  Pt will report <5/10 dizziness symptoms with activities in home and community for improved function.    Time  4    Period  Weeks    Status  New    Target Date  03/22/19   4 weeks out from delayed start date due to scheduling           Plan - 02/05/19 0734    Clinical Impression Statement  Pt progressed well towards goals but continues to be limited by reports of motion sensitivity that bring on nausea. Pt has increased veering from path with gait with objects moving around her decreasing balance. Pt reporting issues with watching TV as well. Dizziness does not appear to be positional. Pt will benefit from continued PT to further address vestibular issues from motion sensitivity for improved function in home and community.    Personal Factors and Comorbidities  Comorbidity 2    Comorbidities  fibromyalgia, depression    Examination-Activity Limitations  Locomotion Level    Stability/Clinical Decision Making  Stable/Uncomplicated    Clinical Decision Making  Low    Rehab Potential  Good    PT Frequency  1x / week    PT Duration  4 weeks   delayed restart due to scheduling issues   PT Treatment/Interventions  ADLs/Self Care Home Management;Neuromuscular re-education;Patient/family education;Gait training;Stair training;Functional mobility training;Therapeutic exercise;Therapeutic activities;Balance training;Manual techniques;Vestibular    PT Next Visit Plan  Complete vestibular exam: work on gait with head turns/nods. Continue with balance exercise progression with SLS activities, tandem and encouraging walking program. Use walking and turns for habituation of motion sensitivity as indicated. Activities to engage core for more stability.    Consulted and Agree with Plan of Care  Patient       Patient will benefit from skilled therapeutic intervention in order to improve the following deficits and impairments:  Abnormal gait, Decreased  balance, Postural dysfunction, Decreased strength, Dizziness  Visit Diagnosis: Other abnormalities of gait and mobility  Dizziness and giddiness     Problem List Patient Active Problem List   Diagnosis Date Noted  . Gait abnormality 12/16/2018  . Complaints of memory disturbance 12/16/2018  . Myalgia and myositis 01/02/2014  . Restless legs syndrome (RLS) 12/09/2013  . Tremor 09/09/2013  . Scoliosis 09/15/2011  . 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 HYPERLIPIDEMIA 03/08/2010  . HERPES ZOSTER 02/28/2010  . VAGINAL BLEEDING, ABNORMAL 12/03/2009  . LOOSE STOOLS 12/03/2009  . UNSPECIFIED URINARY INCONTINENCE 12/03/2009  . WEIGHT GAIN 10/08/2009  . DRY SKIN 10/15/2008  . INSOMNIA 10/15/2008  . WEIGHT LOSS, RECENT 10/15/2008  . VAGINAL PRURITUS 07/31/2008  . SKIN TAG 06/26/2008  . SEBORRHEIC KERATOSIS 06/26/2008  . PRURITUS 05/28/2008  . FATIGUE, CHRONIC 05/28/2008  . DISTURBANCES OF SENSATION OF SMELL AND TASTE 05/28/2008  . MASS, CHEST WALL 05/28/2008  . ANEMIA-NOS 05/15/2008  . ANXIETY 05/15/2008  . DEPRESSION 05/15/2008    Electa Sniff, PT, DPT, NCS 02/05/2019, 7:48 AM  [facility] [address] [place], [place], [address] Phone:  [phone]   Fax:  [phone]  Name: Brandi Mora MRN: [id number] Date of Birth: [birth year]

## 2019-02-12 ENCOUNTER — Ambulatory Visit: Payer: Medicare Other

## 2019-02-24 ENCOUNTER — Ambulatory Visit: Payer: Medicare Other | Admitting: Physical Therapy

## 2019-02-24 ENCOUNTER — Other Ambulatory Visit: Payer: Self-pay

## 2019-02-24 ENCOUNTER — Encounter: Payer: Self-pay | Admitting: Physical Therapy

## 2019-02-24 DIAGNOSIS — R2689 Other abnormalities of gait and mobility: Secondary | ICD-10-CM | POA: Diagnosis not present

## 2019-02-24 DIAGNOSIS — R42 Dizziness and giddiness: Secondary | ICD-10-CM

## 2019-02-24 NOTE — Patient Instructions (Addendum)
Visuo-Vestibular: Head / Eyes Moving in Same Direction    Holding a target, keep eyes on target and slowly move target, head, and eyes in SAME direction side to side for 10 repetitions side to side.  Repeat with up and down, 10 repetitions. Perform standing with feet apart. Repeat __2__ times per session. Do __2__ sessions per day  Copyright  VHI. All rights reserved.     Access Code: [email or web address]     URL: [email or web address]    Date: 01/15/2019  Prepared by: Cherly Anderson   Exercises Single Leg Stance - 3 reps - 1 sets - 30 sec hold - 2x daily - 7x weekly Tandem Stance - 3 reps - 1 sets - 30 sec hold - 2x daily - 7x weekly Heel rises with counter support - 10 reps - 3 sets - 1x daily - 7x weekly Standing Toe Raises at Chair - 10 reps - 3 sets - 1x daily - 7x weekly Supine March with Posterior Pelvic Tilt - 10 reps - 2 sets - 1x daily- 7x weekly                                                                                                                                                                                                                                                                                 -

## 2019-02-25 NOTE — Therapy (Signed)
[facility] [address] [place], [place], [address] Phone: [phone]   Fax:  [phone]  Physical Therapy Treatment  Patient Details  Name: Brandi Mora MRN: [id number] Date of Birth: 09-26-1955 Referring Provider (PT): Margie EgeSarah Slack, NP   Encounter Date: 02/24/2019  PT End of Session - 02/25/19 1105    Visit Number  6    Number of Visits  9    Date for PT Re-Evaluation  03/22/19    Authorization Type  medicare/medicaid - 10th visit PN    PT Start Time  1754    PT Stop Time  1838    PT Time Calculation (min)  44 min    Activity Tolerance  Patient tolerated treatment well    Behavior During Therapy  [facility]/performed       Past Medical History:  Diagnosis Date  . Anemia   . Anxiety   . Depression   . Fibromyalgia   . High cholesterol   . Mildly obese   . Myalgia and myositis 01/02/2014  . Restless legs syndrome (RLS) 12/09/2013  . Tremor 09/09/2013    Past Surgical History:  Procedure Laterality Date  . COSMETIC SURGERY  1976  . dermoid cyst removal  1990  . OOPHORECTOMY    . vagal nerve stimulator placement Left     There were no vitals filed for this visit.  Subjective Assessment - 02/24/19 1800    Subjective  Motion sensitivity has stayed about the same.  Feels like balance is better but still very sensitive to things moving in her visual field.  Even watching sign language interpreters on TV is provocating.  No falls and no changes to medications.    Pertinent History  significant depression and fibromyalgia    Patient Stated Goals  The patient notes improving steadiness and being able to walk a straight line.    Currently in Pain?  No/denies    Pain Onset  In the past 7 days             Vestibular Assessment - 02/24/19 1801      Symptom Behavior   Subjective history of current problem  Vision is not 20/20 but does not wear glasses or contacts.  Has not had a vision  exam.    Type of Dizziness   Comment   nausea; migraine headache   Frequency of Dizziness  daily    Duration of Dizziness  minutes    Symptom Nature  Motion provoked    Aggravating Factors  Comment   increased visual flow   Progression of Symptoms  No change since onset      Oculomotor Exam   Oculomotor Alignment  Normal    Ocular ROM  WNL    Spontaneous  Absent    Gaze-induced   Absent    Smooth Pursuits  Intact    Saccades  Hypometric;Undershoots   with vertical saccades     Oculomotor Exam-Fixation Suppressed    Left Head Impulse  positive    Right Head Impulse  negative      Vestibulo-Ocular Reflex   VOR to Slow Head Movement  Normal    VOR Cancellation  Corrective saccades      Positional Sensitivities   Nose to Right Knee  --   nausea   Right Knee to Sitting  --   nausea   Nose to Left Knee  --   nausea   Left Knee to Sitting  --  nausea   Head Turning x 5  No dizziness    Head Nodding x 5  --   nausea   Pivot Right in Standing  No dizziness    Pivot Left in Standing  --   nausea               Vestibular Treatment/Exercise - 02/24/19 1822      Vestibular Treatment/Exercise   Vestibular Treatment Provided  Gaze    Gaze Exercises  X2 Viewing Horizontal;X2 Viewing Vertical;Eye/Head Exercise Horizontal;Eye/Head Exercise Vertical      X2 Viewing Horizontal   Foot Position  seated     Comments  unable to coordinate and perform      Eye/Head Exercise Horizontal   Foot Position  seated and then standing    Reps  10    Comments  VOR cancellation; no symptoms in sitting, mild in standing      Eye/Head Exercise Vertical   Foot Position  seated and then standing    Reps  10    Comments  VOR cancellation; no symptoms in sitting, mild in standing            PT Education - 02/25/19 1104    Education Details  updated HEP    Person(s) Educated  Patient    Methods  Explanation;Demonstration;Handout    Comprehension  Verbalized  understanding;Returned demonstration          PT Long Term Goals - 02/05/19 0740      PT LONG TERM GOAL #1   Title  The patient will be independent with HEP for strengthening, habituation exercises and high level balance.    Baseline  Pt has been progressing with her HEP but not performing as instructed. Has started to walk occasionally.    Time  4    Period  Weeks    Status  Revised    Target Date  03/22/19   4 weeks out from delayed start date due to scheduling     PT LONG TERM GOAL #2   Title  The patient will improve FGA from 19/30 to > or equal to 24/30 to demonstrate dec'ing risk for falls.    Baseline  25/30 on FGA today meeting goal.    Time  4    Period  Weeks    Status  Achieved      PT LONG TERM GOAL #3   Title  The patient will maintain single leg stance x 4 seconds bilaterally to demonstrate improving single leg stance control for functional tasks.    Baseline  pt able to maintain SLS >10 each LE    Time  4    Period  Weeks    Status  Achieved      PT LONG TERM GOAL #4   Title  The patient will demonstrate improved core stability per ability to return demo postural alignment with shoulders over hips (instead of posterior to hips).    Baseline  pt needed cuing to brace abdominal muscles to improve alignment as shoulders still posterior to hips.    Time  4    Period  Weeks    Status  On-going    Target Date  03/22/19   4 weeks out from delayed start date due to delay in scheduling     PT LONG TERM GOAL #5   Title  Pt will report <5/10 dizziness symptoms with activities in home and community for improved function.    Time  4  Period  Weeks    Status  New    Target Date  03/22/19   4 weeks out from delayed start date due to scheduling           Plan - 02/25/19 1105    Clinical Impression Statement  Pt returns for further assessment and treatment of motion sensitivity.  Performed re-assessment of vestibular system with pt demonstrating impaired  oculomotor exam (impaired saccades and VOR cancellation) as well as delayed VOR as indicated by positive L head impulse test as well as visual motion sensitivity.  Revised vestibular HEP focusing on use of VOR cancellation for visual motion sensitivity training.  Pt reported mild symptoms. Will continue to address and progress towards LTG.    Personal Factors and Comorbidities  Comorbidity 2    Comorbidities  fibromyalgia, depression    Examination-Activity Limitations  Locomotion Level    Stability/Clinical Decision Making  Stable/Uncomplicated    Rehab Potential  Good    PT Frequency  1x / week    PT Duration  4 weeks   delayed restart due to scheduling issues   PT Treatment/Interventions  ADLs/Self Care Home Management;Neuromuscular re-education;Patient/family education;Gait training;Stair training;Functional mobility training;Therapeutic exercise;Therapeutic activities;Balance training;Manual techniques;Vestibular    PT Next Visit Plan  Progress visual motion sensitivity training/VOR cancellation; work on gait with head turns/nods. Continue with balance exercise progression with SLS activities, tandem and encouraging walking program. Use walking and turns for habituation of motion sensitivity as indicated. Activities to engage core for more stability.    Consulted and Agree with Plan of Care  Patient       Patient will benefit from skilled therapeutic intervention in order to improve the following deficits and impairments:  Abnormal gait, Decreased balance, Postural dysfunction, Decreased strength, Dizziness  Visit Diagnosis: Other abnormalities of gait and mobility  Dizziness and giddiness     Problem List Patient Active Problem List   Diagnosis Date Noted  . Gait abnormality 12/16/2018  . Complaints of memory disturbance 12/16/2018  . Myalgia and myositis 01/02/2014  . Restless legs syndrome (RLS) 12/09/2013  . Tremor 09/09/2013  . Scoliosis 09/15/2011  . 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 HYPERLIPIDEMIA 03/08/2010  . HERPES ZOSTER 02/28/2010  . VAGINAL BLEEDING, ABNORMAL 12/03/2009  . LOOSE STOOLS 12/03/2009  . UNSPECIFIED URINARY INCONTINENCE 12/03/2009  . WEIGHT GAIN 10/08/2009  . DRY SKIN 10/15/2008  . INSOMNIA 10/15/2008  . WEIGHT LOSS, RECENT 10/15/2008  . VAGINAL PRURITUS 07/31/2008  . SKIN TAG 06/26/2008  . SEBORRHEIC KERATOSIS 06/26/2008  . PRURITUS 05/28/2008  . FATIGUE, CHRONIC 05/28/2008  . DISTURBANCES OF SENSATION OF SMELL AND TASTE 05/28/2008  . MASS, CHEST WALL 05/28/2008  . ANEMIA-NOS 05/15/2008  . ANXIETY 05/15/2008  . DEPRESSION 05/15/2008    Rico Junker, PT, DPT 02/25/19    11:10 AM    [facility] [address] [place], [place], [address] Phone: [phone]   Fax:  [phone]  Name: Brandi Mora MRN: [id number] Date of Birth: 01-08-1956

## 2019-03-03 ENCOUNTER — Encounter: Payer: Self-pay | Admitting: Physical Therapy

## 2019-03-03 ENCOUNTER — Other Ambulatory Visit: Payer: Self-pay

## 2019-03-03 ENCOUNTER — Ambulatory Visit: Payer: Medicare Other | Admitting: Physical Therapy

## 2019-03-03 DIAGNOSIS — R2689 Other abnormalities of gait and mobility: Secondary | ICD-10-CM | POA: Diagnosis not present

## 2019-03-03 DIAGNOSIS — R42 Dizziness and giddiness: Secondary | ICD-10-CM

## 2019-03-03 NOTE — Therapy (Signed)
[facility] [address] [place], [place], [address] Phone: [phone]   Fax:  [phone]  Physical Therapy Treatment  Patient Details  Name: Brandi Mora MRN: [id number] Date of Birth: 07/01/1955 Referring Provider (PT): Margie Ege, NP   Encounter Date: 03/03/2019  PT End of Session - 03/03/19 2118    Visit Number  7    Number of Visits  9    Date for PT Re-Evaluation  03/22/19    Authorization Type  medicare/medicaid - 10th visit PN    PT Start Time  1750    PT Stop Time  1835    PT Time Calculation (min)  45 min    Activity Tolerance  Patient tolerated treatment well    Behavior During Therapy  [facility] for tasks assessed/performed       Past Medical History:  Diagnosis Date  . Anemia   . Anxiety   . Depression   . Fibromyalgia   . High cholesterol   . Mildly obese   . Myalgia and myositis 01/02/2014  . Restless legs syndrome (RLS) 12/09/2013  . Tremor 09/09/2013    Past Surgical History:  Procedure Laterality Date  . COSMETIC SURGERY  1976  . dermoid cyst removal  1990  . OOPHORECTOMY    . vagal nerve stimulator placement Left     There were no vitals filed for this visit.  Subjective Assessment - 03/03/19 1752    Subjective  No real changes in symptoms since last session, no symptoms when performing HEP.  Visual stimuli on TV (sign language), her cat grooming self and back up camera on car are biggest triggers.    Pertinent History  significant depression and fibromyalgia    Patient Stated Goals  The patient notes improving steadiness and being able to walk a straight line.    Currently in Pain?  No/denies    Pain Onset  In the past 7 days                       [facility] Adult PT Treatment/Exercise - 03/03/19 2103      Therapeutic Activites    Therapeutic Activities  --      Neuro Re-ed    Neuro Re-ed Details   Following VOR cancellation training with letter, tennis ball and  then bean bag continued to alter task to identify triggers of patient's motion sensitivity.  Utilized heavy object hanging from string moving in a variety of directions to provide pt with visual stimulus that is outside of her "control".  Had object move forwards/backwards, side to side and diagonal in front of visual field while having pt standing with narrow BOS, with busy background and then on compliant surface x 12-15 reps each.  Pt cued to follow moving object with eyes and head.  With each activity pt reported it was "difficult" but no reproduction of dizziness or nausea.  Pt continued to report symptoms while watching her TV, her cat or back up camera and that symptoms were intermittent and unpredictable.  Pt may be experiencing a mismatch between visual information and proprioceptive information.      Vestibular Treatment/Exercise - 03/03/19 1754      Vestibular Treatment/Exercise   Vestibular Treatment Provided  Gaze      Eye/Head Exercise Horizontal   Foot Position  standing in place    Reps  15    Comments  VOR cancellation first while holding letter no symptoms - pt  reports she may have less symptoms because she is in control of the movement of the target; changed to standing in place and passing tennis ball or bean bag from hand to hand for horizontal eye and head movements; attempted to perform 10 in a row but pt had increased difficulty with eye/hand coordination and would frequently drop the ball.  Attempted to toss ball to patient but pt continued to drop the ball.  No symptoms reported.        Eye/Head Exercise Vertical   Foot Position  ambulating around gym    Comments  x 3 laps while tossing tennis ball and then bean bag up in the air and catching with same hand while ambulating for vertical eye and head movements.  Mild symptoms            PT Education - 03/03/19 2113    Education Details  Due to PT being unable to reproduce symptoms today and due to patient's report of  what does trigger her symptoms - discussed possibility that symptoms may not be vestibular in nature and may not respond to therapy.  Advised pt to continue with current HEP and will utilize one more session to see if PT can reproduce symptoms in order to provide pt with most effective HEP and treatment interventions by utilizing [employer].    Person(s) Educated  Patient    Methods  Explanation    Comprehension  Verbalized understanding          PT Long Term Goals - 02/05/19 0740      PT LONG TERM GOAL #1   Title  The patient will be independent with HEP for strengthening, habituation exercises and high level balance.    Baseline  Pt has been progressing with her HEP but not performing as instructed. Has started to walk occasionally.    Time  4    Period  Weeks    Status  Revised    Target Date  03/22/19   4 weeks out from delayed start date due to scheduling     PT LONG TERM GOAL #2   Title  The patient will improve FGA from 19/30 to > or equal to 24/30 to demonstrate dec'ing risk for falls.    Baseline  25/30 on FGA today meeting goal.    Time  4    Period  Weeks    Status  Achieved      PT LONG TERM GOAL #3   Title  The patient will maintain single leg stance x 4 seconds bilaterally to demonstrate improving single leg stance control for functional tasks.    Baseline  pt able to maintain SLS >10 each LE    Time  4    Period  Weeks    Status  Achieved      PT LONG TERM GOAL #4   Title  The patient will demonstrate improved core stability per ability to return demo postural alignment with shoulders over hips (instead of posterior to hips).    Baseline  pt needed cuing to brace abdominal muscles to improve alignment as shoulders still posterior to hips.    Time  4    Period  Weeks    Status  On-going    Target Date  03/22/19   4 weeks out from delayed start date due to delay in scheduling     PT LONG TERM GOAL #5   Title  Pt will report <5/10 dizziness symptoms with  activities in home  and community for improved function.    Time  4    Period  Weeks    Status  New    Target Date  03/22/19   4 weeks out from delayed start date due to scheduling           Plan - 03/03/19 2118    Clinical Impression Statement  PT continues to have difficulty reproducing patient's symptoms during a functional activity and pt is not reporting any significant functional limitations at home when symptoms occur.  Will utilize Pension scheme manager at next session to assess integration of visual and proprioceptive input.  If no significant impairment is found, will proceed with finalizing HEP and D/C.  If specific impairments are identified, will address accordingly.    Personal Factors and Comorbidities  Comorbidity 2    Comorbidities  fibromyalgia, depression    Examination-Activity Limitations  Locomotion Level    Stability/Clinical Decision Making  Stable/Uncomplicated    Rehab Potential  Good    PT Frequency  1x / week    PT Duration  4 weeks   delayed restart due to scheduling issues   PT Treatment/Interventions  ADLs/Self Care Home Management;Neuromuscular re-education;Patient/family education;Gait training;Stair training;Functional mobility training;Therapeutic exercise;Therapeutic activities;Balance training;Manual techniques;Vestibular    PT Next Visit Plan  Balance master SOT assessment/training with walls moving - if no symptoms plant to D/C due to lack of functional impairments that will respond positively to therapy.    Consulted and Agree with Plan of Care  Patient       Patient will benefit from skilled therapeutic intervention in order to improve the following deficits and impairments:  Abnormal gait, Decreased balance, Postural dysfunction, Decreased strength, Dizziness  Visit Diagnosis: Dizziness and giddiness  Other abnormalities of gait and mobility     Problem List Patient Active Problem List   Diagnosis Date Noted  . Gait abnormality 12/16/2018   . Complaints of memory disturbance 12/16/2018  . Myalgia and myositis 01/02/2014  . Restless legs syndrome (RLS) 12/09/2013  . Tremor 09/09/2013  . Scoliosis 09/15/2011  . 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 HYPERLIPIDEMIA 03/08/2010  . HERPES ZOSTER 02/28/2010  . VAGINAL BLEEDING, ABNORMAL 12/03/2009  . LOOSE STOOLS 12/03/2009  . UNSPECIFIED URINARY INCONTINENCE 12/03/2009  . WEIGHT GAIN 10/08/2009  . DRY SKIN 10/15/2008  . INSOMNIA 10/15/2008  . WEIGHT LOSS, RECENT 10/15/2008  . VAGINAL PRURITUS 07/31/2008  . SKIN TAG 06/26/2008  . SEBORRHEIC KERATOSIS 06/26/2008  . PRURITUS 05/28/2008  . FATIGUE, CHRONIC 05/28/2008  . DISTURBANCES OF SENSATION OF SMELL AND TASTE 05/28/2008  . MASS, CHEST WALL 05/28/2008  . ANEMIA-NOS 05/15/2008  . ANXIETY 05/15/2008  . DEPRESSION 05/15/2008    Rico Junker, PT, DPT 03/03/19    9:25 PM    [facility] [address] [place], [place], [address] Phone: [phone]   Fax:  [phone]  Name: Brandi Mora MRN: [id number] Date of Birth: 1956/01/29

## 2019-03-03 NOTE — Patient Instructions (Signed)
Visuo-Vestibular: Head / Eyes Moving in Same Direction    Holding a target, keep eyes on target and slowly move target, head, and eyes in SAME direction side to side for 10 repetitions side to side.  Repeat with up and down, 10 repetitions. Perform standing with feet apart. Repeat __2__ times per session. Do __2__ sessions per day  Copyright  VHI. All rights reserved.     Access Code: [email or web address]     URL: [email or web address]    Date: 01/15/2019  Prepared by: Emily Parker   Exercises Single Leg Stance - 3 reps - 1 sets - 30 sec hold - 2x daily - 7x weekly Tandem Stance - 3 reps - 1 sets - 30 sec hold - 2x daily - 7x weekly Heel rises with counter support - 10 reps - 3 sets - 1x daily - 7x weekly Standing Toe Raises at Chair - 10 reps - 3 sets - 1x daily - 7x weekly Supine March with Posterior Pelvic Tilt - 10 reps - 2 sets - 1x daily- 7x weekly                                                                                                                                                                                                                                                                                 -     

## 2019-03-10 ENCOUNTER — Encounter: Payer: Medicare Other | Admitting: Physical Therapy

## 2019-03-14 ENCOUNTER — Encounter

## 2019-03-17 ENCOUNTER — Ambulatory Visit: Payer: Medicare Other | Attending: Neurology | Admitting: Physical Therapy

## 2019-03-17 ENCOUNTER — Other Ambulatory Visit: Payer: Self-pay

## 2019-03-17 ENCOUNTER — Encounter: Payer: Self-pay | Admitting: Physical Therapy

## 2019-03-17 DIAGNOSIS — R42 Dizziness and giddiness: Secondary | ICD-10-CM | POA: Insufficient documentation

## 2019-03-17 DIAGNOSIS — R293 Abnormal posture: Secondary | ICD-10-CM | POA: Insufficient documentation

## 2019-03-17 DIAGNOSIS — M6281 Muscle weakness (generalized): Secondary | ICD-10-CM | POA: Diagnosis present

## 2019-03-17 DIAGNOSIS — R2689 Other abnormalities of gait and mobility: Secondary | ICD-10-CM | POA: Diagnosis present

## 2019-03-17 NOTE — Therapy (Signed)
[facility] [address] [place], [place], [address] Phone: [phone]   Fax:  [phone]  Physical Therapy Treatment  Patient Details  Name: Brandi Mora MRN: [id number] Date of Birth: 1955-12-24 Referring Provider (PT): Margie EgeSarah Slack, NP   Encounter Date: 03/17/2019  PT End of Session - 03/17/19 2154    Visit Number  8    Number of Visits  9    Date for PT Re-Evaluation  03/22/19    Authorization Type  medicare/medicaid - 10th visit PN    PT Start Time  1750    PT Stop Time  1830    PT Time Calculation (min)  40 min    Activity Tolerance  Patient tolerated treatment well    Behavior During Therapy  [facility] for tasks assessed/performed       Past Medical History:  Diagnosis Date  . Anemia   . Anxiety   . Depression   . Fibromyalgia   . High cholesterol   . Mildly obese   . Myalgia and myositis 01/02/2014  . Restless legs syndrome (RLS) 12/09/2013  . Tremor 09/09/2013    Past Surgical History:  Procedure Laterality Date  . COSMETIC SURGERY  1976  . dermoid cyst removal  1990  . OOPHORECTOMY    . vagal nerve stimulator placement Left     There were no vitals filed for this visit.  Subjective Assessment - 03/17/19 1755    Subjective  Felt a little unsettled after last visit.  No real changes in symptoms    Pertinent History  significant depression and fibromyalgia    Patient Stated Goals  The patient notes improving steadiness and being able to walk a straight line.    Currently in Pain?  No/denies    Pain Onset  In the past 7 days             Vestibular Assessment - 03/17/19 1800      Balancemaster   CuratorBalancemaster  Sensory organization test    Balancemaster Comment  composite score of 53, below norms for age range        Conditions: 1: 3 trials WFL 2: 2 trials WFL  3: 3 trials WFL 4: 3 trials below normal limits 5: 3 trials below normal limits 6: 2 falls and third trial not  completed due to pt reporting nausea Composite score: 53 Sensory Analysis Som: WNL Vis: Below normal Vest: Below normal Pref: Below normal Strategy analysis: 50-75% use of hip/ankle strategy; more use of head and shoulder leaning to correct balance COG alignment: Pt with COG slightly anterior and to the R     [facility] Adult PT Treatment/Exercise - 03/17/19 2152      Therapeutic Activites    Therapeutic Activities  Other Therapeutic Activities    Other Therapeutic Activities  results of SOT, norms and dominant sensory/balance system.  Discussed use of balance reactions and need to train greater use of hip strategy.  Schedule one more visit to make up one missed due to therapist absence             PT Education - 03/17/19 2154    Education Details  See TA    Person(s) Educated  Patient    Methods  Explanation    Comprehension  Verbalized understanding          PT Long Term Goals - 03/17/19 2158      PT LONG TERM GOAL #1   Title  The patient will  be independent with HEP for strengthening, habituation exercises and high level balance.    Baseline  Pt has been progressing with her HEP but not performing as instructed. Has started to walk occasionally.    Time  4    Period  Weeks    Status  Revised    Target Date  03/22/19      PT LONG TERM GOAL #2   Title  The patient will improve FGA from 19/30 to > or equal to 24/30 to demonstrate dec'ing risk for falls.    Baseline  25/30 on FGA today meeting goal.    Time  4    Period  Weeks    Status  Achieved      PT LONG TERM GOAL #3   Title  The patient will maintain single leg stance x 4 seconds bilaterally to demonstrate improving single leg stance control for functional tasks.    Baseline  pt able to maintain SLS >10 each LE    Time  4    Period  Weeks    Status  Achieved      PT LONG TERM GOAL #4   Title  The patient will demonstrate improved core stability per ability to return demo postural alignment with shoulders  over hips (instead of posterior to hips).    Baseline  pt needed cuing to brace abdominal muscles to improve alignment as shoulders still posterior to hips.    Time  4    Period  Weeks    Status  On-going      PT LONG TERM GOAL #5   Title  Pt will report <5/10 dizziness symptoms with activities in home and community for improved function.    Time  4    Period  Weeks    Status  New      Additional Long Term Goals   Additional Long Term Goals  Yes      PT LONG TERM GOAL #6   Title  Pt will demonstrate 8 point improvement in SOT composite score due to improved use of visual and vestibular information for balance.    Baseline  53    Status  New    Target Date  03/22/19            Plan - 03/17/19 2155    Clinical Impression Statement  No change in patient's symptoms from previous session.  Continued to discuss other things that may contribute to her symptoms - pt does report that when she sleeps poorly, her symptoms are worse.  May need to discuss sleep at next session.  Performed SOT with pt demonstrating increased reliance on somatosensory information.  Pt also demonstrates poor use of hip strategy.  Test did reproduce patient's symptoms.  Added one more visit to address.    Personal Factors and Comorbidities  Comorbidity 2    Comorbidities  fibromyalgia, depression    Examination-Activity Limitations  Locomotion Level    Stability/Clinical Decision Making  Stable/Uncomplicated    Rehab Potential  Good    PT Frequency  1x / week    PT Duration  4 weeks   delayed restart due to scheduling issues   PT Treatment/Interventions  ADLs/Self Care Home Management;Neuromuscular re-education;Patient/family education;Gait training;Stair training;Functional mobility training;Therapeutic exercise;Therapeutic activities;Balance training;Manual techniques;Vestibular    PT Next Visit Plan  check LTG - recert?  discuss sleep hygiene.  balance training with compliant and unstable surfaces - work on  hip strategy.    Consulted and Agree with  Plan of Care  Patient       Patient will benefit from skilled therapeutic intervention in order to improve the following deficits and impairments:  Abnormal gait, Decreased balance, Postural dysfunction, Decreased strength, Dizziness  Visit Diagnosis: Dizziness and giddiness  Other abnormalities of gait and mobility  Muscle weakness (generalized)  Abnormal posture     Problem List Patient Active Problem List   Diagnosis Date Noted  . Gait abnormality 12/16/2018  . Complaints of memory disturbance 12/16/2018  . Myalgia and myositis 01/02/2014  . Restless legs syndrome (RLS) 12/09/2013  . Tremor 09/09/2013  . Scoliosis 09/15/2011  . 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 HYPERLIPIDEMIA 03/08/2010  . HERPES ZOSTER 02/28/2010  . VAGINAL BLEEDING, ABNORMAL 12/03/2009  . LOOSE STOOLS 12/03/2009  . UNSPECIFIED URINARY INCONTINENCE 12/03/2009  . WEIGHT GAIN 10/08/2009  . DRY SKIN 10/15/2008  . INSOMNIA 10/15/2008  . WEIGHT LOSS, RECENT 10/15/2008  . VAGINAL PRURITUS 07/31/2008  . SKIN TAG 06/26/2008  . SEBORRHEIC KERATOSIS 06/26/2008  . PRURITUS 05/28/2008  . FATIGUE, CHRONIC 05/28/2008  . DISTURBANCES OF SENSATION OF SMELL AND TASTE 05/28/2008  . MASS, CHEST WALL 05/28/2008  . ANEMIA-NOS 05/15/2008  . ANXIETY 05/15/2008  . DEPRESSION 05/15/2008    Rico Junker, PT, DPT 03/17/19    10:04 PM    [facility] [address] [place], [place], [address] Phone: [phone]   Fax:  [phone]  Name: Brandi Mora MRN: [id number] Date of Birth: 08-14-1955

## 2019-04-01 ENCOUNTER — Other Ambulatory Visit: Payer: Self-pay | Admitting: Family Medicine

## 2019-04-01 DIAGNOSIS — Z9289 Personal history of other medical treatment: Secondary | ICD-10-CM

## 2019-04-21 ENCOUNTER — Ambulatory Visit: Payer: Medicare Other | Attending: Neurology | Admitting: Physical Therapy

## 2019-04-21 ENCOUNTER — Other Ambulatory Visit: Payer: Self-pay

## 2019-04-21 ENCOUNTER — Encounter: Payer: Self-pay | Admitting: Physical Therapy

## 2019-04-21 DIAGNOSIS — R2689 Other abnormalities of gait and mobility: Secondary | ICD-10-CM

## 2019-04-21 DIAGNOSIS — R42 Dizziness and giddiness: Secondary | ICD-10-CM | POA: Diagnosis present

## 2019-04-21 NOTE — Patient Instructions (Addendum)
Visuo-Vestibular: Head / Eyes Moving in Same Direction    Holding a target, keep eyes on target and slowly move target, head, and eyes in SAME direction side to side for 10 repetitions side to side.  Repeat with up and down, 10 repetitions. Perform standing with feet apart. Repeat __2__ times per session. Do __2__ sessions per day   Feet Together (Compliant Surface) Head Motion - Eyes Open    With eyes open, standing on compliant surface: soft pillows, feet together, move head slowly: up and down 10 times, side to side 10 times slowly.  Eyes OPEN Repeat 1 times per session. Do 1 sessions per day.    Balance: Eyes Closed - Bilateral (Varied Surfaces)    Stand on a pillow with feet together.  Have a chair in front of you for support, close eyes. Maintain balance 10 seconds. Repeat 2 times per set. Do 1 sets per session. Do 1 sessions per week. Repeat on compliant surface: pillows       Access Code: [email or web address]     URL: [email or web address]    Date: 01/15/2019  Prepared by: Elmer Bales   Exercises Single Leg Stance - 3 reps - 1 sets - 30 sec hold - 2x daily - 7x weekly Tandem Stance - 3 reps - 1 sets - 30 sec hold - 2x daily - 7x weekly Heel rises with counter support - 10 reps - 3 sets - 1x daily - 7x weekly Standing Toe Raises at Chair - 10 reps - 3 sets - 1x daily - 7x weekly Supine March with Posterior Pelvic Tilt - 10 reps - 2 sets - 1x daily- 7x weekly                                                                                                                                                                                                                                                                                 -

## 2019-04-22 NOTE — Therapy (Signed)
[facility] [address] [place], [place], [address] Phone: [phone]   Fax:  [phone]  Physical Therapy Treatment/Re-certification and Discharge Summary  Patient Details  Name: Brandi Mora MRN: [id number] Date of Birth: 1955-05-19 Referring Provider (PT): Butler Denmark, NP   Encounter Date: 04/21/2019  PT End of Session - 04/22/19 1524    Visit Number  9    Number of Visits  9    Date for PT Re-Evaluation  26/37/85   recert for one visit only for D/C   Authorization Type  medicare/medicaid - 10th visit PN    PT Start Time  1709    PT Stop Time  1754    PT Time Calculation (min)  45 min    Activity Tolerance  Patient tolerated treatment well    Behavior During Therapy  [facility] for tasks assessed/performed       Past Medical History:  Diagnosis Date  . Anemia   . Anxiety   . Depression   . Fibromyalgia   . High cholesterol   . Mildly obese   . Myalgia and myositis 01/02/2014  . Restless legs syndrome (RLS) 12/09/2013  . Tremor 09/09/2013    Past Surgical History:  Procedure Laterality Date  . COSMETIC SURGERY  1976  . dermoid cyst removal  1990  . OOPHORECTOMY    . vagal nerve stimulator placement Left     There were no vitals filed for this visit.  Subjective Assessment - 04/21/19 1712    Subjective  Switched her medications from day to night and night medications to morning and nausea has resolved.  Dizziness is better but not gone.    Pertinent History  significant depression and fibromyalgia    Patient Stated Goals  The patient notes improving steadiness and being able to walk a straight line.    Currently in Pain?  No/denies    Pain Onset  In the past 7 days         [facility] PT Assessment - 04/22/19 1521      Assessment   Medical Diagnosis  gait instability, memory disturbance    Referring Provider (PT)  Butler Denmark, NP    Onset Date/Surgical Date  12/16/18      Precautions   Precautions  Fall    Precaution Comments  no recent falls      Prior Function   Level of Independence  Independent    Vocation  On disability         Vestibular Assessment - 04/22/19 1521      Balancemaster   Engineer, materials Comment  Composite score increased slightly to 55 but continues to be below normal level for age range.  See results below.        Conditions: 1: 3 trials below normal limits  2: 2 trials WNL, one trial below normal limits  3: 3 trials below normal limits 4: 3 trials below normal limits 5: 2 trials below normal limits and third trial WNL  6: one fall, final two trials below normal limits (improved from previous assessment where pt experienced 2 falls and unable to complete final trial due to severity of symptoms) Composite score: 55 improved from 53 but not significant Sensory Analysis Som: WNL Vis: Below normal Vest: Below normal Pref: Below normal but each sensory system had increased in percentage of usage Strategy analysis: continues to be primarily ankle strategy COG alignment: Pt with COG aligned to  the L and posteriorly           Balance Exercises - 04/22/19 1522      Balance Exercises: Standing   Standing Eyes Opened  Narrow base of support (BOS);Head turns;Foam/compliant surface;Other reps (comment)   10 reps head nods/turns, supervision in corner   Standing Eyes Closed  Narrow base of support (BOS);Foam/compliant surface;3 reps;10 secs   feet together, UE support to prevent fall       PT Education - 04/22/19 1524    Education Details  SOT results, final HEP to continue to perform at home, D/C today    Person(s) Educated  Patient    Methods  Explanation;Demonstration;Handout    Comprehension  Verbalized understanding;Returned demonstration      Visuo-Vestibular: Head / Eyes Moving in Same Direction    Holding a target, keep eyes on target and slowly move target, head, and eyes in  SAME direction side to side for 10 repetitions side to side.  Repeat with up and down, 10 repetitions. Perform standing with feet apart. Repeat __2__ times per session. Do __2__ sessions per day   Feet Together (Compliant Surface) Head Motion - Eyes Open    With eyes open, standing on compliant surface: soft pillows, feet together, move head slowly: up and down 10 times, side to side 10 times slowly.  Eyes OPEN Repeat 1 times per session. Do 1 sessions per day.    Balance: Eyes Closed - Bilateral (Varied Surfaces)    Stand on a pillow with feet together.  Have a chair in front of you for support, close eyes. Maintain balance 10 seconds. Repeat 2 times per set. Do 1 sets per session. Do 1 sessions per week. Repeat on compliant surface: pillows       Access Code: [email or web address]     URL: [email or web address]    Date: 01/15/2019  Prepared by: Cherly Anderson   Exercises Single Leg Stance - 3 reps - 1 sets - 30 sec hold - 2x daily - 7x weekly Tandem Stance - 3 reps - 1 sets - 30 sec hold - 2x daily - 7x weekly Heel rises with counter support - 10 reps - 3 sets - 1x daily - 7x weekly Standing Toe Raises at Chair - 10 reps - 3 sets - 1x daily - 7x weekly Supine March with Posterior Pelvic Tilt - 10 reps - 2 sets - 1x daily- 7x weekly                                                                                                                                                                                                                                                                                 -  PT Long Term Goals - 04/21/19 1715      PT LONG TERM GOAL #1   Title  The patient will be independent with HEP for strengthening, habituation exercises and high level balance.    Baseline  Pt has been progressing with her HEP but not performing as instructed. Has started to walk occasionally.    Time  4    Period  Weeks    Status  Achieved      PT LONG  TERM GOAL #2   Title  The patient will improve FGA from 19/30 to > or equal to 24/30 to demonstrate dec'ing risk for falls.    Baseline  25/30 on FGA today meeting goal.    Time  4    Period  Weeks    Status  Achieved      PT LONG TERM GOAL #3   Title  The patient will maintain single leg stance x 4 seconds bilaterally to demonstrate improving single leg stance control for functional tasks.    Baseline  pt able to maintain SLS >10 each LE    Time  4    Period  Weeks    Status  Achieved      PT LONG TERM GOAL #4   Title  The patient will demonstrate improved core stability per ability to return demo postural alignment with shoulders over hips (instead of posterior to hips).    Baseline  pt needed cuing to brace abdominal muscles to improve alignment as shoulders still posterior to hips.    Time  4    Period  Weeks    Status  Deferred      PT LONG TERM GOAL #5   Title  Pt will report <5/10 dizziness symptoms with activities in home and community for improved function.    Baseline  6/10 at the worst watching TV dizziness, no further nausea    Time  4    Period  Weeks    Status  Partially Met      PT LONG TERM GOAL #6   Title  Pt will demonstrate 8 point improvement in SOT composite score due to improved use of visual and vestibular information for balance.    Baseline  53 > 55    Status  Not Met            Plan - 04/22/19 1528    Clinical Impression Statement  Pt has made little to no progress with vestibular therapy - only change in symptoms experienced when pt made changes to medication.  Pt continues to experience symptoms of dizziness when watching TV or when watching her cat groom itself - therapist unable to provoke symptoms except for when pt participating in SOT assessment.  Pt continues to rely heavily on somatosensory input - exercises added to HEP to facilitate increased use of vestibular input and decrease visual motion sensitivity.  Pt has met 3/6 LTG, partially met  one goal, core/posture goal deferred as this was not an onoging focus of recent therapy visits and did not meet SOT goal - score increased only by 2 points.  Pt is ready for D/C today and is independent with HEP.    Personal Factors and Comorbidities  Comorbidity 2    Comorbidities  fibromyalgia, depression    Examination-Activity Limitations  Locomotion Level    Stability/Clinical Decision Making  Stable/Uncomplicated    Rehab Potential  Good    PT Frequency  One time visit    PT Duration  Other (comment)   one visit for D/C   PT Treatment/Interventions  ADLs/Self Care Home Management;Neuromuscular re-education;Patient/family education;Gait training;Stair training;Functional mobility training;Therapeutic exercise;Therapeutic activities;Balance training;Manual techniques;Vestibular    PT Next Visit Plan  D/C    Consulted and Agree with Plan of Care  Patient       Patient will benefit from skilled therapeutic intervention in order to improve the following deficits and impairments:  Abnormal gait, Decreased balance, Postural dysfunction, Decreased strength, Dizziness  Visit Diagnosis: Dizziness and giddiness  Other abnormalities of gait and mobility     Problem List Patient Active Problem List   Diagnosis Date Noted  . Gait abnormality 12/16/2018  . Complaints of memory disturbance 12/16/2018  . Myalgia and myositis 01/02/2014  . Restless legs syndrome (RLS) 12/09/2013  . Tremor 09/09/2013  . Scoliosis 09/15/2011  . 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 HYPERLIPIDEMIA 03/08/2010  . HERPES ZOSTER 02/28/2010  . VAGINAL BLEEDING, ABNORMAL 12/03/2009  . LOOSE STOOLS 12/03/2009  . UNSPECIFIED URINARY INCONTINENCE 12/03/2009  . WEIGHT GAIN 10/08/2009  . DRY SKIN 10/15/2008  . INSOMNIA 10/15/2008  . WEIGHT LOSS, RECENT 10/15/2008  . VAGINAL PRURITUS 07/31/2008  . SKIN TAG 06/26/2008  . SEBORRHEIC KERATOSIS 06/26/2008  . PRURITUS 05/28/2008  . FATIGUE, CHRONIC 05/28/2008  .  DISTURBANCES OF SENSATION OF SMELL AND TASTE 05/28/2008  . MASS, CHEST WALL 05/28/2008  . ANEMIA-NOS 05/15/2008  . ANXIETY 05/15/2008  . DEPRESSION 05/15/2008    PHYSICAL THERAPY DISCHARGE SUMMARY  Visits from Start of Care: 9  Current functional level related to goals / functional outcomes: See LTG achievement and impression statement above   Remaining deficits: Impaired balance, visual motion sensitivity and dizziness   Education / Equipment: Final HEP  Plan: Patient agrees to discharge.  Patient goals were partially met. Patient is being discharged due to being pleased with the current functional level.  ?????     Rico Junker, PT, DPT 04/22/19    3:40 PM    [facility] [address] [place], [place], [address] Phone: [phone]   Fax:  [phone]  Name: Brandi Mora MRN: [id number] Date of Birth: April 15, 1955

## 2019-05-12 ENCOUNTER — Ambulatory Visit: Payer: Medicare Other

## 2019-06-09 ENCOUNTER — Ambulatory Visit
Admission: RE | Admit: 2019-06-09 | Discharge: 2019-06-09 | Disposition: A | Discharge status: Home / Self Care | Payer: Medicare Other | Source: Ambulatory Visit | Attending: Family Medicine | Admitting: Family Medicine

## 2019-06-09 ENCOUNTER — Other Ambulatory Visit: Payer: Self-pay

## 2019-06-09 DIAGNOSIS — Z9289 Personal history of other medical treatment: Secondary | ICD-10-CM

## 2019-06-18 ENCOUNTER — Ambulatory Visit (INDEPENDENT_AMBULATORY_CARE_PROVIDER_SITE_OTHER): Payer: Medicare Other | Admitting: Neurology

## 2019-06-18 ENCOUNTER — Encounter: Payer: Self-pay | Admitting: Neurology

## 2019-06-18 ENCOUNTER — Other Ambulatory Visit: Payer: Self-pay

## 2019-06-18 VITALS — BP 112/64 | HR 69 | Temp 97.5°F | Ht 67.0 in | Wt 141.0 lb

## 2019-06-18 DIAGNOSIS — G4700 Insomnia, unspecified: Secondary | ICD-10-CM | POA: Diagnosis not present

## 2019-06-18 DIAGNOSIS — R269 Unspecified abnormalities of gait and mobility: Secondary | ICD-10-CM | POA: Diagnosis not present

## 2019-06-18 NOTE — Patient Instructions (Signed)
Try increasing the exercise during the day, take melatonin 5 mg at night.

## 2019-06-18 NOTE — Progress Notes (Signed)
Reason for visit: Gait disturbance, chronic insomnia  Brandi Mora is an 64 y.o. female  History of present illness:  Brandi Mora is a 64 year old right-handed white female with a history of depression, fibromyalgia, and a reported memory issue.  The patient recently underwent a CT scan of the brain that did show some frontal atrophy bilaterally.  The patient believes that her memory issues have been somewhat better recently.  The patient is having some problems with insomnia over the last month or so, she wonders whether some family stress issues have caused this.  The patient has not had any falls, she remains quite inactive during the day, she spends most of her time watching TV or on the computer, she does very little physical activity.  She will try to go to bed at night and will lay there for 3 to 4 hours before she can get to sleep and then she will only sleep for 3 hours or so and then wake up and cannot get back to sleep.  She claims that she feels better when she gets up to 12 hours of sleep at night.  Past Medical History:  Diagnosis Date  . Anemia   . Anxiety   . Depression   . Fibromyalgia   . High cholesterol   . Mildly obese   . Myalgia and myositis 01/02/2014  . Restless legs syndrome (RLS) 12/09/2013  . Tremor 09/09/2013    Past Surgical History:  Procedure Laterality Date  . COSMETIC SURGERY  1976  . dermoid cyst removal  1990  . OOPHORECTOMY    . vagal nerve stimulator placement Left     Family History  Problem Relation Age of Onset  . Heart disease Mother        enlarged heart  . Hypertension Mother   . Cancer Mother        ovarian  . Cancer Father        Prostate/skin  . Diabetes Father        type 2  . Alzheimer's disease Father   . Cancer Maternal Grandfather        prostate   . Breast cancer Paternal Aunt   . Tremor Neg Hx     Social history:  reports that she has never smoked. She has never used smokeless tobacco. She reports  current alcohol use. She reports that she does not use drugs.    Allergies  Allergen Reactions  . Elavil [Amitriptyline Hcl] Nausea And Vomiting, Nausea Only and Other (See Comments)    Dizziness;crying  . Wellbutrin [Bupropion Hcl] Other (See Comments)    Confusion    Medications:  Prior to Admission medications   Medication Sig Start Date End Date Taking? Authorizing Provider  Acetaminophen (TYLENOL PO) Take by mouth.   Yes [provider]  busPIRone (BUSPAR) 30 MG tablet Take 1 tablet (30 mg total) by mouth 2 (two) times daily. 11/13/11  Yes Le, Thao P, DO  caffeine 200 MG TABS tablet Take 200 mg by mouth every 4 (four) hours as needed.   Yes [provider]  FLUoxetine (PROZAC) 20 MG capsule Take 3 capsules (60 mg total) by mouth daily. 11/13/11  Yes Le, Thao P, DO  lamoTRIgine (LAMICTAL) 200 MG tablet Take 1 tablet (200 mg total) by mouth daily. Take 1/2 tablet in the morning and 1 tablet in the evening 11/13/11  Yes Le, Thao P, DO  meclizine (ANTIVERT) 25 MG tablet Take 25 mg by mouth 3 (  three) times daily as needed for dizziness.   Yes [provider]  ondansetron (ZOFRAN-ODT) 4 MG disintegrating tablet DISSOLVE 1 TABLET IN MOUTH EVERY 8 HOURS AS NEEDED 11/05/18  Yes [provider]  rOPINIRole (REQUIP) 0.25 MG tablet Take 0.5 mg by mouth 3 (three) times daily.    Yes [provider]  traMADol (ULTRAM) 50 MG tablet Take by mouth every 6 (six) hours as needed.   Yes [provider]    ROS:  Out of a complete 14 system review of symptoms, the patient complains only of the following symptoms, and all other reviewed systems are negative.  Insomnia Depression  Blood pressure 112/64, pulse 69, temperature (!) 97.5 F (36.4 C), height 5\' 7"  (1.702 m), weight 141 lb (64 kg).  Physical Exam  General: The patient is alert and cooperative at the time of the examination.  The patient is moderately obese.  Skin: No significant  peripheral edema is noted.   Neurologic Exam  Mental status: The patient is alert and oriented x 3 at the time of the examination. The patient has apparent normal recent and remote memory, with an apparently normal attention span and concentration ability.   Cranial nerves: Facial symmetry is present. Speech is normal, no aphasia or dysarthria is noted. Extraocular movements are full. Visual fields are full.  Motor: The patient has good strength in all 4 extremities.  Sensory examination: Soft touch sensation is symmetric on the face, arms, and legs.  Coordination: The patient has good finger-nose-finger and heel-to-shin bilaterally.  The patient appears to have tardive movements of the head and neck, occasional vocalizations.  She is somewhat fidgety with the legs.  Gait and station: The patient has a normal gait. Tandem gait is minimally unsteady. Romberg is negative. No drift is seen.  Reflexes: Deep tendon reflexes are symmetric.   CT head 01/31/19:  IMPRESSION: This CT scan of the head without contrast shows the following: 1.   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  This is also noted in the CT scan from 2016 and there has been mild progression. 2.   No acute findings.  * CT scan images were reviewed online. I agree with the written report.    Assessment/Plan:  1.  Reported memory disturbance  2.  Mild gait disturbance  3.  Chronic insomnia  The patient is to go on melatonin 5 mg at night, I have instructed her to engage in more regular exercise to help her sleep habits at night.  Her memory issues she claims are stable.  We will follow up with her in 1 year.  Greater than 50% of the visit was spent in counseling and coordination of care.  Face-to-face time with the patient was 25 minutes.   Jill Alexanders MD 06/18/2019 4:00 PM  [facility] [address] [place], [place] [address]  Phone [phone]  Fax [phone]

## 2020-05-04 ENCOUNTER — Other Ambulatory Visit: Payer: Self-pay | Admitting: Family Medicine

## 2020-05-04 DIAGNOSIS — Z1231 Encounter for screening mammogram for malignant neoplasm of breast: Secondary | ICD-10-CM

## 2020-06-22 ENCOUNTER — Inpatient Hospital Stay: Admission: RE | Admit: 2020-06-22 | Payer: Medicare Other | Source: Ambulatory Visit

## 2020-08-17 ENCOUNTER — Ambulatory Visit
Admission: RE | Admit: 2020-08-17 | Discharge: 2020-08-17 | Disposition: A | Discharge status: Home / Self Care | Payer: Medicare Other | Source: Ambulatory Visit | Attending: Family Medicine | Admitting: Family Medicine

## 2020-08-17 ENCOUNTER — Other Ambulatory Visit: Payer: Self-pay

## 2020-08-17 DIAGNOSIS — Z1231 Encounter for screening mammogram for malignant neoplasm of breast: Secondary | ICD-10-CM | POA: Diagnosis not present

## 2020-12-10 DIAGNOSIS — R5382 Chronic fatigue, unspecified: Secondary | ICD-10-CM | POA: Diagnosis not present

## 2020-12-10 DIAGNOSIS — E538 Deficiency of other specified B group vitamins: Secondary | ICD-10-CM | POA: Diagnosis not present

## 2020-12-10 DIAGNOSIS — Z5181 Encounter for therapeutic drug level monitoring: Secondary | ICD-10-CM | POA: Diagnosis not present

## 2020-12-10 DIAGNOSIS — Z23 Encounter for immunization: Secondary | ICD-10-CM | POA: Diagnosis not present

## 2020-12-10 DIAGNOSIS — R112 Nausea with vomiting, unspecified: Secondary | ICD-10-CM | POA: Diagnosis not present

## 2020-12-10 DIAGNOSIS — E559 Vitamin D deficiency, unspecified: Secondary | ICD-10-CM | POA: Diagnosis not present

## 2021-01-16 IMAGING — US US ABDOMEN LIMITED
1 series · 14 of 25 positions shown · non-contrast
Comparison: Ultrasound November 02, 2014.

CLINICAL DATA: Dyspepsia.

EXAM:
ULTRASOUND ABDOMEN LIMITED RIGHT UPPER QUADRANT

[Series 1: us abdomen limited · 0.20mm/px · 14 of 44 slices shown]
[im 1/44]
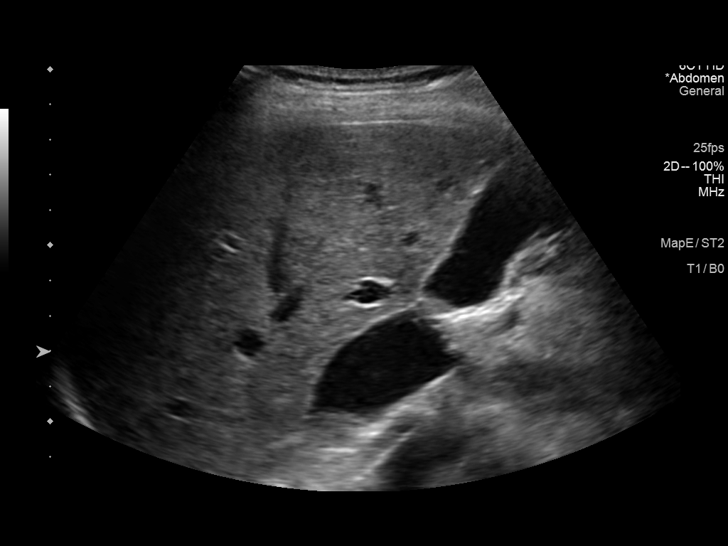
[im 4/44]
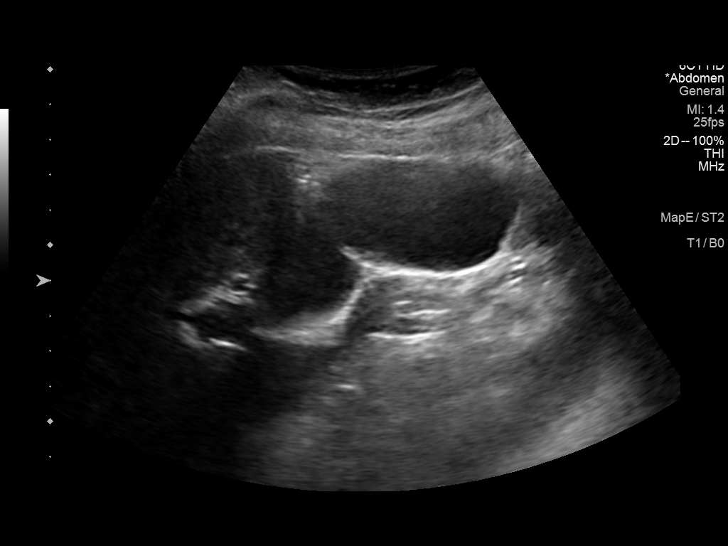
[im 8/44]
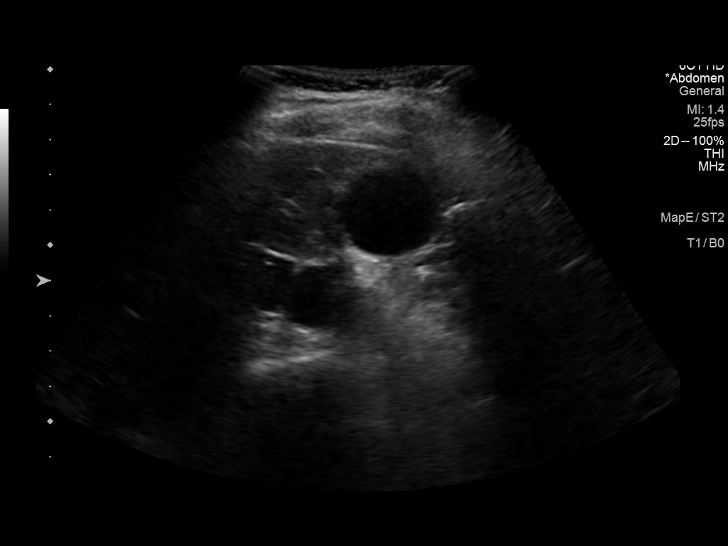
[im 11/44]
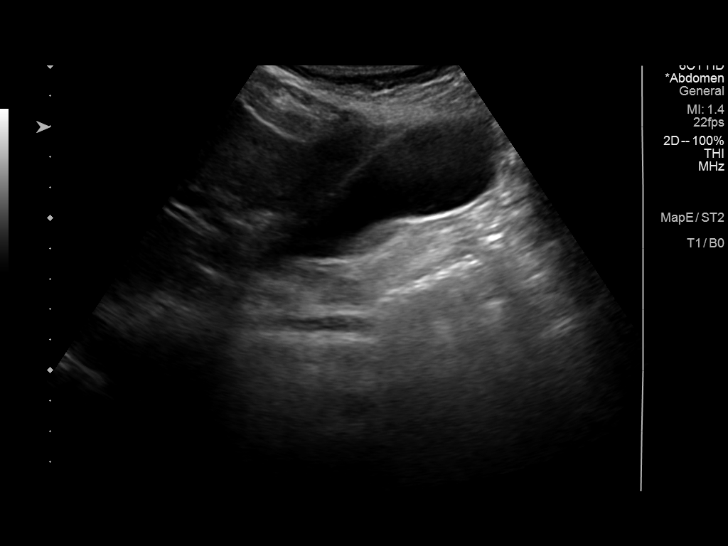
[im 15/44]
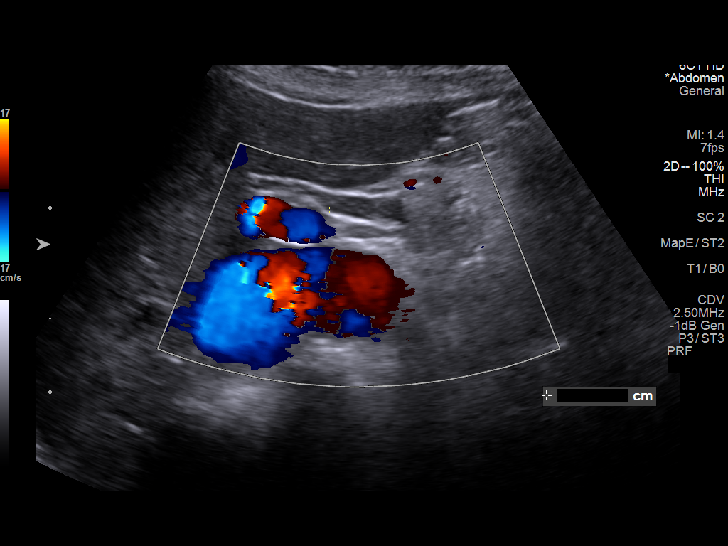
[im 17/44]
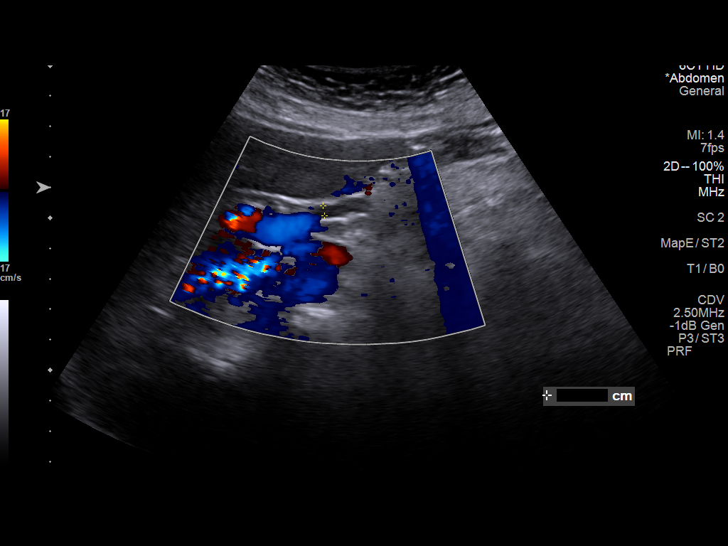
[im 20/44]
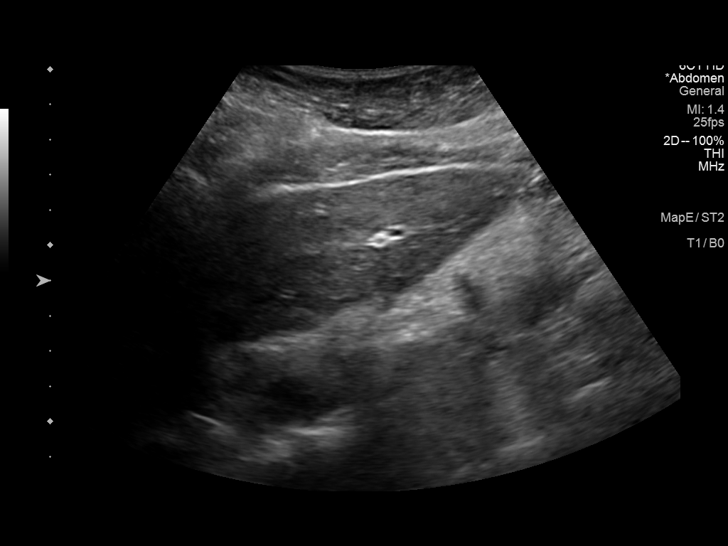
[im 24/44]
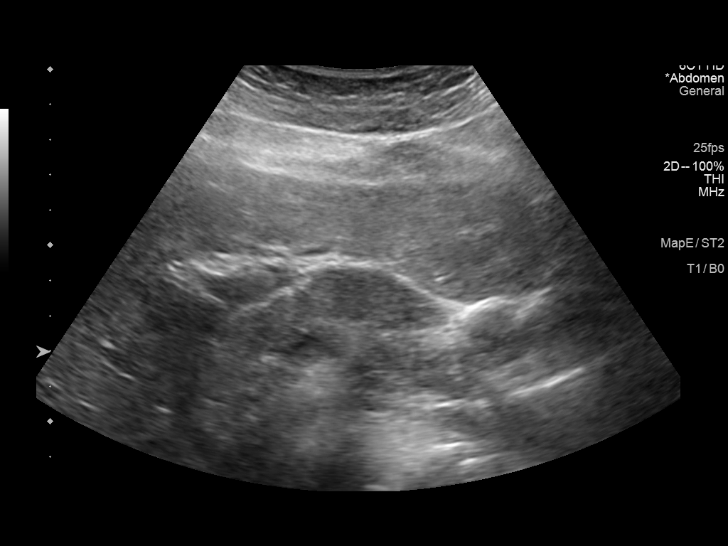
[im 27/44]
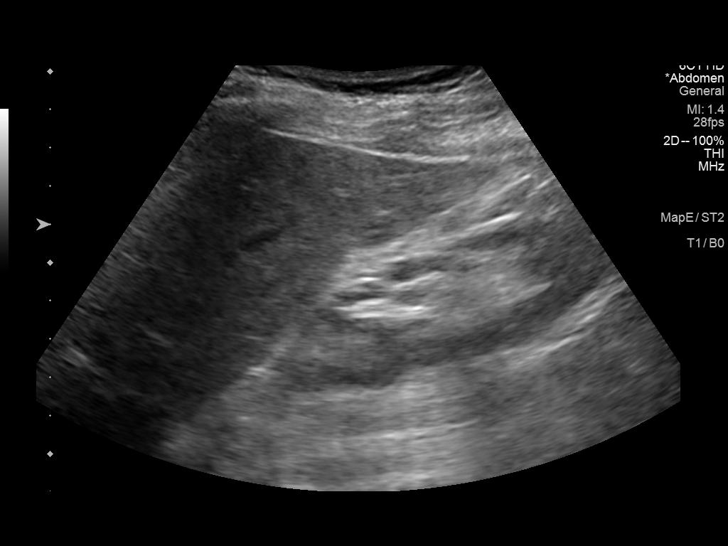
[im 29/44]
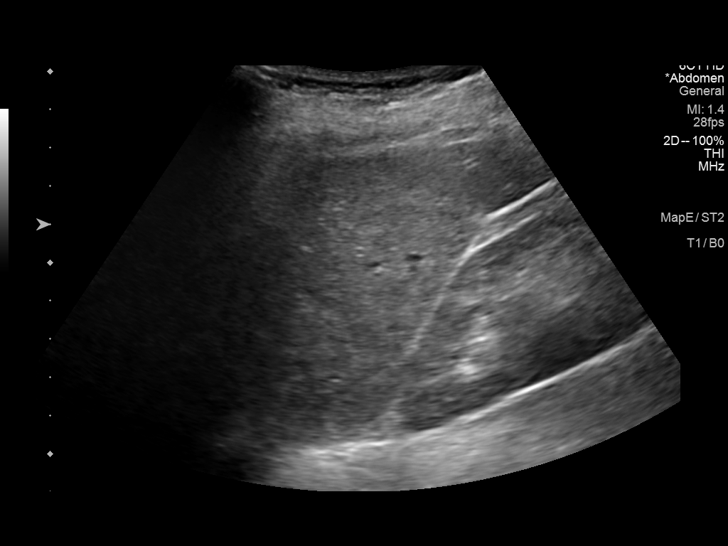
[im 33/44]
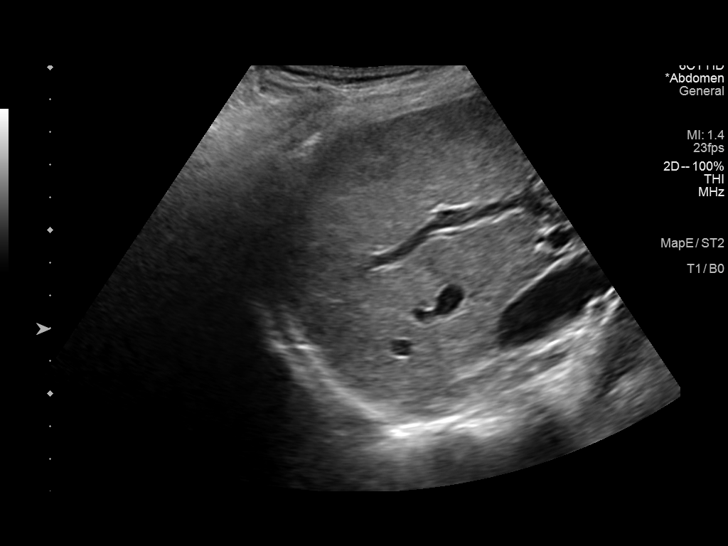
[im 36/44]
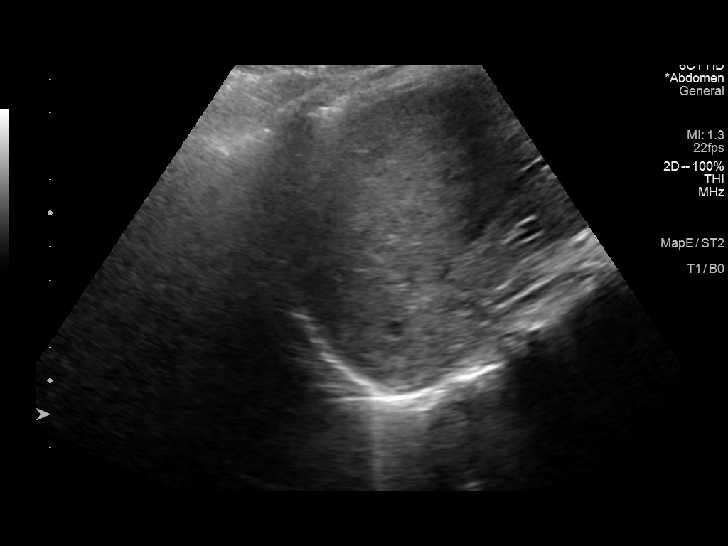
[im 40/44]
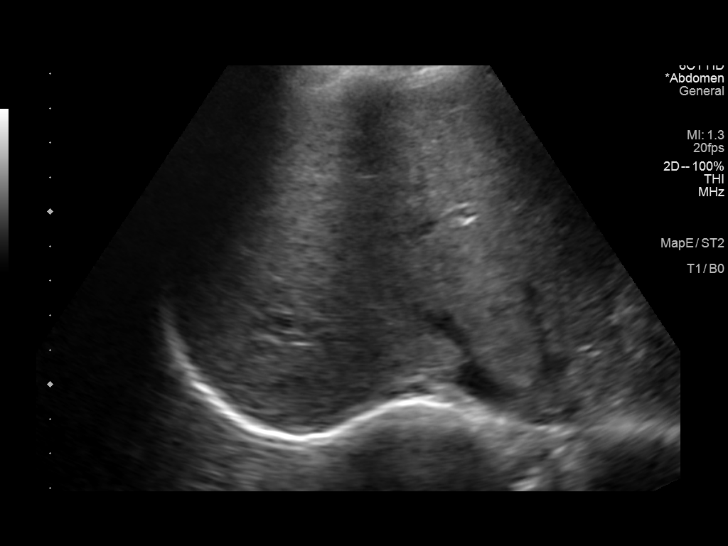
[im 44/44]
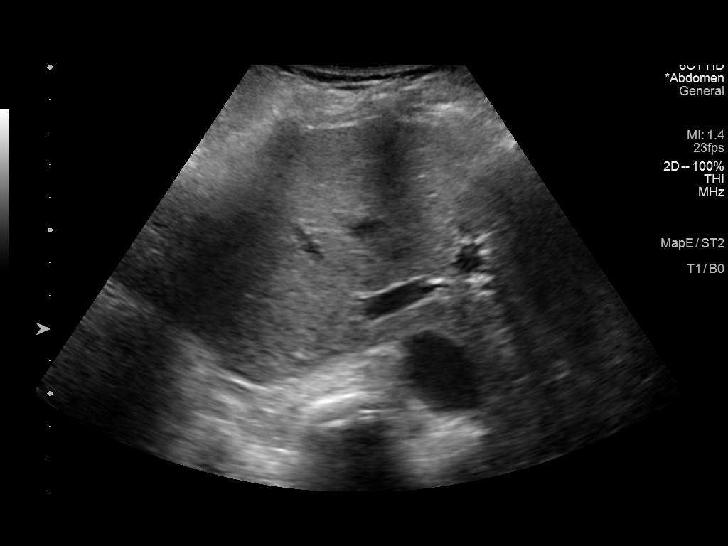

[14 of 25 positions shown; findings below may reference images not displayed]

FINDINGS: Gallbladder:

No gallstones or wall thickening visualized. No sonographic Murphy
sign noted by sonographer.

Common bile duct:

Diameter: 5 mm which is within normal limits.

Liver:

No focal lesion identified. Within normal limits in parenchymal
echogenicity. Portal vein is patent on color Doppler imaging with
normal direction of blood flow towards the liver.

Other: None.
IMPRESSION: No abnormality seen in the right upper quadrant of the abdomen.

## 2021-02-03 DIAGNOSIS — K269 Duodenal ulcer, unspecified as acute or chronic, without hemorrhage or perforation: Secondary | ICD-10-CM | POA: Diagnosis not present

## 2021-03-04 DIAGNOSIS — M79601 Pain in right arm: Secondary | ICD-10-CM | POA: Diagnosis not present

## 2021-04-06 ENCOUNTER — Ambulatory Visit: Payer: Medicare Other

## 2021-04-13 ENCOUNTER — Other Ambulatory Visit: Payer: Self-pay

## 2021-04-13 ENCOUNTER — Ambulatory Visit: Payer: Medicare Other | Attending: Family Medicine

## 2021-04-13 DIAGNOSIS — M79601 Pain in right arm: Secondary | ICD-10-CM | POA: Insufficient documentation

## 2021-04-13 DIAGNOSIS — R252 Cramp and spasm: Secondary | ICD-10-CM | POA: Diagnosis not present

## 2021-04-13 NOTE — Patient Instructions (Signed)
Access Code: [email or web address] URL: [email or web address] Date: 04/13/2021 Prepared by: Tresa Endo  Exercises Supine Shoulder Flexion AAROM with Hands Clasped - 3 x daily - 7 x weekly - 1 sets - 10 reps - 10 hold Seated Scapular Retraction - 3 x daily - 7 x weekly - 10 reps - 5 hold Shoulder Flexion Wall Slide with Towel - 2 x daily - 7 x weekly - 1 sets - 10 reps - 5 hold Standing Shoulder Abduction Slides at Wall - 2 x daily - 7 x weekly - 3 sets - 10 reps - 5 hold

## 2021-04-13 NOTE — Therapy (Signed)
[facility] [place] [address] [place], [place], [address] Phone: [phone]   Fax:  [phone]  Physical Therapy Evaluation  Patient Details  Name: Brandi Mora MRN: [id number] Date of Birth: [birth year] Referring Provider (PT): Garth Bignessimberlake, Kathryn, MD   Encounter Date: 04/13/2021   PT End of Session - 04/13/21 1528     Visit Number 1    Date for PT Re-Evaluation 06/08/21    Authorization Type UHC Medicare    Progress Note Due on Visit 10    PT Start Time 1452    PT Stop Time 1529    PT Time Calculation (min) 37 min    Activity Tolerance Patient tolerated treatment well    Behavior [facility] tasks assessed/performed             Past Medical History:  Diagnosis Date   Anemia    Anxiety    Depression    Fibromyalgia    High cholesterol    Mildly obese    Myalgia and myositis 01/02/2014   Restless legs syndrome (RLS) 12/09/2013   Tremor 09/09/2013    Past Surgical History:  Procedure Laterality Date   COSMETIC SURGERY  1976   dermoid cyst removal  1990   OOPHORECTOMY     vagal nerve stimulator placement Left     There were no vitals filed for this visit.    Subjective Assessment - 04/13/21 1453     Subjective Pt reports to PT with onset of Rt arm pain that began 2 months ago without incident or injury.  Pt is Rt handed.    Pertinent History fibromyalgia, depression, anxiety    How long can you walk comfortably? Rt UE: using 75% of normal, limiting use with lifiting and reaching    Diagnostic tests none    Patient Stated Goals improve use of Rt UE, sleep on Lt side    Currently in Pain? Yes    Pain Score 2    9/10- with aggravating tasks.   Pain Location Arm    Pain Orientation Right    Pain Descriptors / Indicators Sore;Aching;Tightness    Pain Type Chronic pain    Pain Onset More than a month ago    Pain Frequency Intermittent    Aggravating Factors  reaching overhead, holding  cell phone, lifting, sleep on Rt side    Pain Relieving Factors stopping aggravating activity, topical rub                [facility] PT Assessment - 04/13/21 0001       Assessment   Medical Diagnosis Rt arm pain with fibromyalgia    Referring Provider (PT) Garth Bignessimberlake, Kathryn, MD    Onset Date/Surgical Date 02/09/21    Hand Dominance Right    Next MD Visit next week    Prior Therapy none      Precautions   Precautions Fall      Restrictions   Weight Bearing Restrictions No      Balance Screen   Has the patient fallen in the past 6 months No    Has the patient had a decrease in activity level because of a fear of falling?  No    Is the patient reluctant to leave their home because of a fear of falling?  No      Home [employer] managernvironment   Living Environment Private residence    Living Arrangements Alone      Prior Function  Level of Independence Independent    Vocation Retired;On disability    Leisure take photos      Cognition   Overall Cognitive Status Within Functional Limits for tasks assessed      Observation/Other Assessments   Focus on Therapeutic Outcomes (FOTO)  44 (63 is goal)      Posture/Postural Control   Posture/Postural Control Postural limitations    Postural Limitations Posterior pelvic tilt;Flexed trunk;Forward head;Decreased lumbar lordosis      ROM / Strength   AROM / PROM / Strength AROM;Strength      AROM   Overall AROM  Within functional limits for tasks performed    Overall AROM Comments Rt=Lt shoulder ROM.  Pain with overpressure at end range on Rt in all directions.      Strength   Overall Strength Deficits    Overall Strength Comments 4+/5 Lt shoulder strength, 4/5 Rt shoulder strength with pain upon testing      Palpation   Palpation comment No palpable tendernes in Rt axilla.  Trigger points in Rt lats, posterior shoulder musculature and over AC joint.      Ambulation/Gait   Ambulation/Gait Yes    Ambulation/Gait Assistance 7:  Independent                        Objective measurements completed on examination: See above findings.                PT Education - 04/13/21 1524     Education Details Access Code: [email or web address]    Person(s) Educated Patient    Methods Explanation;Demonstration;Handout    Comprehension Verbalized understanding;Returned demonstration              PT Short Term Goals - 04/13/21 1507       PT SHORT TERM GOAL #1   Title be independent in initial HEP    Time 4    Period Weeks    Status New    Target Date 05/11/21      PT SHORT TERM GOAL #2   Title report < or = to 6/10 Rt arm pain with reaching and lifting with daily tasks    Time 4    Period Weeks    Status New    Target Date 05/11/21      PT SHORT TERM GOAL #3   Title report > or = to 85% use of the Rt UE with daily tasks    Time 4    Period Weeks    Status New    Target Date 05/11/21               PT Long Term Goals - 04/13/21 1509       PT LONG TERM GOAL #1   Title be independent in initial HEP    Baseline --    Time 8    Period Weeks    Status New    Target Date 06/08/21      PT LONG TERM GOAL #2   Title improve FOTO to > or = to 63    Baseline 44    Time 8    Period Weeks    Status New    Target Date 06/08/21      PT LONG TERM GOAL #3   Title report > or = to 95% use of Rt UE with ADLs and self-care due to reduced pain    Baseline 75%    Time 8  Period Weeks    Status New    Target Date 06/08/21      PT LONG TERM GOAL #4   Title demonstrate 4+/5 Rt shoulder strength to improve endurance with use    Baseline --    Time 8    Period Weeks    Status New    Target Date 06/08/21      PT LONG TERM GOAL #5   Title report < or = to 3/10 Rt shoulder pain with use    Baseline 9/10    Time 8    Period Weeks    Status New    Target Date 06/08/21                    Plan - 04/13/21 1543     Clinical Impression Statement Pt is a Rt hand dominant  female and presents to PT with complaints of Rt arm pain that began 2 months ago without incident or injury.  Pt reports that she is using her arm 75% of normal due to pain and is limited with lifting, reaching and sleep on the Rt side.  Pt reports 2-9/10 Rt shoulder pain that is increased with the mentioned activities.  No imaging has been done.  Pt with poor seated posture with flexed trunk, forward head and scapular protraction.  Painful end range motion on the Rt shoulder with trigger points over Rt lats and posterior shoulder musculature.  Pt will benefit from skilled PT to address postural strength, shoulder strength, functional training and pain management as needed.    Personal Factors and Comorbidities Comorbidity 2    Comorbidities fibromyalgia, anxiety    Examination-Participation Restrictions Cleaning;Driving;Shop    Stability/Clinical Decision Making Stable/Uncomplicated    Clinical Decision Making Low    Rehab Potential Good    PT Frequency 2x / week    PT Duration 8 weeks    PT Treatment/Interventions ADLs/Self Care Home Management;Moist Heat;Iontophoresis 4mg /ml Dexamethasone;Cryotherapy;Electrical Stimulation;Therapeutic activities;Therapeutic exercise;Neuromuscular re-education;Patient/family education;Manual techniques;Passive range of motion;Taping    PT Next Visit Plan add postural strength, review HEP, shoulder strength, DN to lats and posterior Rt shoulder    PT Home Exercise Plan Access Code: [email or web address]    Consulted and Agree with Plan of Care Patient             Patient will benefit from skilled therapeutic intervention in order to improve the following deficits and impairments:  Decreased activity tolerance, Impaired UE functional use, Pain, Postural dysfunction, Decreased strength, Improper body mechanics, Increased muscle spasms  Visit Diagnosis: Pain in right arm - Plan: PT plan of care cert/re-cert  Cramp and spasm - Plan: PT plan of care  cert/re-cert     Problem List Patient Active Problem List   Diagnosis Date Noted   Gait abnormality 12/16/2018   Complaints of memory disturbance 12/16/2018   Myalgia and myositis 01/02/2014   Restless legs syndrome (RLS) 12/09/2013   Tremor 09/09/2013   Scoliosis 09/15/2011   MUCOCELE, 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   HYPERLIPIDEMIA 03/08/2010   HERPES ZOSTER 02/28/2010   VAGINAL BLEEDING, ABNORMAL 12/03/2009   LOOSE STOOLS 12/03/2009   UNSPECIFIED URINARY INCONTINENCE 12/03/2009   WEIGHT GAIN 10/08/2009   DRY SKIN 10/15/2008   INSOMNIA 10/15/2008   WEIGHT LOSS, RECENT 10/15/2008   VAGINAL PRURITUS 07/31/2008   SKIN TAG 06/26/2008   SEBORRHEIC KERATOSIS 06/26/2008   PRURITUS 05/28/2008   FATIGUE, CHRONIC 05/28/2008   DISTURBANCES OF SENSATION OF SMELL AND TASTE 05/28/2008   MASS, CHEST WALL  05/28/2008   ANEMIA-NOS 05/15/2008   ANXIETY 05/15/2008   DEPRESSION 05/15/2008   Sigurd Sos, PT 04/13/21 5:14 PM  [facility] @ [place], [place], [address] Phone: [phone]   Fax:  [phone]  Name: Brandi Mora MRN: [id number] Date of Birth: 07/25/1955

## 2021-04-20 DIAGNOSIS — R112 Nausea with vomiting, unspecified: Secondary | ICD-10-CM | POA: Diagnosis not present

## 2021-04-20 DIAGNOSIS — E785 Hyperlipidemia, unspecified: Secondary | ICD-10-CM | POA: Diagnosis not present

## 2021-04-20 DIAGNOSIS — Z0000 Encounter for general adult medical examination without abnormal findings: Secondary | ICD-10-CM | POA: Diagnosis not present

## 2021-04-20 DIAGNOSIS — R42 Dizziness and giddiness: Secondary | ICD-10-CM | POA: Diagnosis not present

## 2021-04-20 DIAGNOSIS — M797 Fibromyalgia: Secondary | ICD-10-CM | POA: Diagnosis not present

## 2021-04-21 ENCOUNTER — Telehealth: Payer: Self-pay | Admitting: Neurology

## 2021-04-21 NOTE — Telephone Encounter (Signed)
Dr Wendi Snipes) office called re: pt seeing Dr Anne Hahn about Vagal Nerve Stimulator , they wanted pt to have a f/u.  With Dr Anne Hahn being retired, phone rep scheduled pt an office visit with Dr Terrace Arabia.  Office staff @ Dr Christena Flake office will give pt the date and time of appointment and our office # if date and time of appointment does not work for her.  This is FYI, the office has not asked for a call back.

## 2021-04-22 DIAGNOSIS — R319 Hematuria, unspecified: Secondary | ICD-10-CM | POA: Diagnosis not present

## 2021-04-22 DIAGNOSIS — R110 Nausea: Secondary | ICD-10-CM | POA: Diagnosis not present

## 2021-04-22 DIAGNOSIS — R42 Dizziness and giddiness: Secondary | ICD-10-CM | POA: Diagnosis not present

## 2021-04-22 DIAGNOSIS — R030 Elevated blood-pressure reading, without diagnosis of hypertension: Secondary | ICD-10-CM | POA: Diagnosis not present

## 2021-04-25 ENCOUNTER — Encounter: Payer: Self-pay | Admitting: Physical Therapy

## 2021-04-25 ENCOUNTER — Other Ambulatory Visit: Payer: Self-pay

## 2021-04-25 ENCOUNTER — Ambulatory Visit: Payer: Medicare Other | Admitting: Physical Therapy

## 2021-04-25 DIAGNOSIS — M79601 Pain in right arm: Secondary | ICD-10-CM | POA: Diagnosis not present

## 2021-04-25 DIAGNOSIS — R252 Cramp and spasm: Secondary | ICD-10-CM | POA: Diagnosis not present

## 2021-04-25 NOTE — Therapy (Signed)
[facility] @ [place], [place], [address] Phone: [phone]   Fax:  [phone]  Physical Therapy Treatment  Patient Details  Name: Brandi Mora MRN: [id number] Date of Birth: [birth year] Referring Provider (PT): Sela Hilding, MD   Encounter Date: 04/25/2021   PT End of Session - 04/25/21 1529     Visit Number 2    Date for PT Re-Evaluation 06/08/21    Authorization Type UHC Medicare    Progress Note Due on Visit 10    PT Start Time 1528    PT Stop Time 1610    PT Time Calculation (min) 42 min    Activity Tolerance Patient tolerated treatment well    Behavior During Therapy [facility] for tasks assessed/performed             Past Medical History:  Diagnosis Date   Anemia    Anxiety    Depression    Fibromyalgia    High cholesterol    Mildly obese    Myalgia and myositis 01/02/2014   Restless legs syndrome (RLS) 12/09/2013   Tremor 09/09/2013    Past Surgical History:  Procedure Laterality Date   COSMETIC SURGERY  1976   dermoid cyst removal  1990   OOPHORECTOMY     vagal nerve stimulator placement Left     There were no vitals filed for this visit.   Subjective Assessment - 04/25/21 1532     Subjective I did not get a chance to do much of my homework.    Pertinent History fibromyalgia, depression, anxiety    How long can you walk comfortably? Rt UE: using 75% of normal, limiting use with lifiting and reaching    Currently in Pain? Yes    Pain Score 2     Pain Location Arm    Pain Orientation Right    Pain Descriptors / Indicators Sore    Multiple Pain Sites No                               [facility] Adult PT Treatment/Exercise - 04/25/21 0001       Shoulder Exercises: Supine   Horizontal ABduction Strengthening;Both;10 reps;Theraband    Theraband Level (Shoulder Horizontal ABduction) Level 1 (Yellow)    External Rotation Strengthening;Both;10 reps;Theraband     Theraband Level (Shoulder External Rotation) Level 1 (Yellow)    Other Supine Exercises 1# flexion punches 10x   Scap retraction 10x done in supine   Other Supine Exercises Fingers clasped 10x, good return demo      Shoulder Exercises: Standing   Flexion --   Cones to first shelf 2x5   Other Standing Exercises Wall slides for flexion and abd 10x,    Other Standing Exercises Yellow band standing rows 10x      Shoulder Exercises: ROM/Strengthening   UBE (Upper Arm Bike) L1 2 min forward, 2 min backward      Manual Therapy   Other Manual Therapy Ice massage x 3 min posteriorlateral shoulder                       PT Short Term Goals - 04/13/21 1507       PT SHORT TERM GOAL #1   Title be independent in initial HEP    Time 4    Period Weeks    Status New    Target Date 05/11/21  PT SHORT TERM GOAL #2   Title report < or = to 6/10 Rt arm pain with reaching and lifting with daily tasks    Time 4    Period Weeks    Status New    Target Date 05/11/21      PT SHORT TERM GOAL #3   Title report > or = to 85% use of the Rt UE with daily tasks    Time 4    Period Weeks    Status New    Target Date 05/11/21               PT Long Term Goals - 04/13/21 1509       PT LONG TERM GOAL #1   Title be independent in initial HEP    Baseline --    Time 8    Period Weeks    Status New    Target Date 06/08/21      PT LONG TERM GOAL #2   Title improve FOTO to > or = to 63    Baseline 44    Time 8    Period Weeks    Status New    Target Date 06/08/21      PT LONG TERM GOAL #3   Title report > or = to 95% use of Rt UE with ADLs and self-care due to reduced pain    Baseline 75%    Time 8    Period Weeks    Status New    Target Date 06/08/21      PT LONG TERM GOAL #4   Title demonstrate 4+/5 Rt shoulder strength to improve endurance with use    Baseline --    Time 8    Period Weeks    Status New    Target Date 06/08/21      PT LONG TERM GOAL #5    Title report < or = to 3/10 Rt shoulder pain with use    Baseline 9/10    Time 8    Period Weeks    Status New    Target Date 06/08/21                   Plan - 04/25/21 1612     Clinical Impression Statement Pt arrives for first time follow up after evaluaiton with mild upper arm/post shouder pain/soreness. Pt reports not really doing her HEP much. Pt was able to demo in todays session correct form, no visual tightness or limitations noted. PTA started pt on gentle postural strengthening exercises and other functional strengthening exercises. Pt appeared to have no issues with these but they were not added to HEP just yet. If pt arrives to next appt and reports good tolerance we can add then. Ionto order not yet signed.    Personal Factors and Comorbidities Comorbidity 2    Comorbidities fibromyalgia, anxiety    Examination-Participation Restrictions Cleaning;Driving;Shop    Stability/Clinical Decision Making Stable/Uncomplicated    Rehab Potential Good    PT Frequency 2x / week    PT Duration 8 weeks    PT Treatment/Interventions ADLs/Self Care Home Management;Moist Heat;Iontophoresis 4mg /ml Dexamethasone;Cryotherapy;Electrical Stimulation;Therapeutic activities;Therapeutic exercise;Neuromuscular re-education;Patient/family education;Manual techniques;Passive range of motion;Taping    PT Next Visit Plan add postural strength, review HEP, shoulder strength, DN to lats and posterior Rt shoulder    PT Home Exercise Plan Access Code: [email or web address]    Consulted and Agree with Plan of Care Patient  Patient will benefit from skilled therapeutic intervention in order to improve the following deficits and impairments:  Decreased activity tolerance, Impaired UE functional use, Pain, Postural dysfunction, Decreased strength, Improper body mechanics, Increased muscle spasms  Visit Diagnosis: Pain in right arm  Cramp and spasm     Problem List Patient Active Problem List    Diagnosis Date Noted   Gait abnormality 12/16/2018   Complaints of memory disturbance 12/16/2018   Myalgia and myositis 01/02/2014   Restless legs syndrome (RLS) 12/09/2013   Tremor 09/09/2013   Scoliosis 09/15/2011   MUCOCELE, 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   HYPERLIPIDEMIA 03/08/2010   HERPES ZOSTER 02/28/2010   VAGINAL BLEEDING, ABNORMAL 12/03/2009   LOOSE STOOLS 12/03/2009   UNSPECIFIED URINARY INCONTINENCE 12/03/2009   WEIGHT GAIN 10/08/2009   DRY SKIN 10/15/2008   INSOMNIA 10/15/2008   WEIGHT LOSS, RECENT 10/15/2008   VAGINAL PRURITUS 07/31/2008   SKIN TAG 06/26/2008   SEBORRHEIC KERATOSIS 06/26/2008   PRURITUS 05/28/2008   FATIGUE, CHRONIC 05/28/2008   DISTURBANCES OF SENSATION OF SMELL AND TASTE 05/28/2008   MASS, CHEST WALL 05/28/2008   ANEMIA-NOS 05/15/2008   ANXIETY 05/15/2008   DEPRESSION 05/15/2008    Herve Haug, PTA 04/25/2021, 4:15 PM  [facility] @ [place], [place], [address] Phone: [phone]   Fax:  [phone]  Name: Brandi Mora MRN: [id number] Date of Birth: 19-Nov-1955

## 2021-04-27 ENCOUNTER — Ambulatory Visit: Payer: Medicare Other

## 2021-05-02 ENCOUNTER — Other Ambulatory Visit: Payer: Self-pay

## 2021-05-02 ENCOUNTER — Encounter: Payer: Self-pay | Admitting: Physical Therapy

## 2021-05-02 ENCOUNTER — Ambulatory Visit: Payer: Medicare Other | Admitting: Physical Therapy

## 2021-05-02 DIAGNOSIS — R252 Cramp and spasm: Secondary | ICD-10-CM | POA: Diagnosis not present

## 2021-05-02 DIAGNOSIS — M79601 Pain in right arm: Secondary | ICD-10-CM | POA: Diagnosis not present

## 2021-05-02 NOTE — Therapy (Signed)
[facility] @ [place] [address] [place], [place], [address] Phone: [phone]   Fax:  [phone]  Physical Therapy Treatment  Patient Details  Name: Brandi Mora MRN: [id number] Date of Birth: [birth year] Referring Provider (PT): Garth Bigness, MD   Encounter Date: 05/02/2021   PT End of Session - 05/02/21 1535     Visit Number 3    Date for PT Re-Evaluation 06/08/21    Authorization Type [employer] Medicare    Progress Note Due on Visit 10    PT Start Time 1533   pt not feeling great   PT Stop Time 1605    PT Time Calculation (min) 32 min    Activity Tolerance Patient tolerated treatment well;Other (comment)   Not feeling great   Behavior During Therapy [facility] for tasks assessed/performed             Past Medical History:  Diagnosis Date   Anemia    Anxiety    Depression    Fibromyalgia    High cholesterol    Mildly obese    Myalgia and myositis 01/02/2014   Restless legs syndrome (RLS) 12/09/2013   Tremor 09/09/2013    Past Surgical History:  Procedure Laterality Date   COSMETIC SURGERY  1976   dermoid cyst removal  1990   OOPHORECTOMY     vagal nerve stimulator placement Left     There were no vitals filed for this visit.   Subjective Assessment - 05/02/21 1533     Subjective I am nauseous today so I am not sure how I will do today.    Pertinent History fibromyalgia, depression, anxiety    Currently in Pain? Yes    Pain Score 1     Pain Location Arm    Pain Orientation Right    Pain Descriptors / Indicators Aching                               [facility] Adult PT Treatment/Exercise - 05/02/21 0001       Shoulder Exercises: Supine   Protraction Right;Strengthening;AROM;5 reps;Weights    Protraction Weight (lbs) 1    Horizontal ABduction Strengthening;Both   10x2   External Rotation Strengthening;Both;Theraband    Theraband Level (Shoulder External Rotation) Level 1 (Yellow)     Other Supine Exercises 1# flexion punches 10x2   Scap retraction 10x2 done in supine   Other Supine Exercises 1#, arm at 90 degrees F: arm circles 10x eac hdirection      Shoulder Exercises: ROM/Strengthening   UBE (Upper Arm Bike) L1 2x2 with PTA present to monitor pt                       PT Short Term Goals - 04/13/21 1507       PT SHORT TERM GOAL #1   Title be independent in initial HEP    Time 4    Period Weeks    Status New    Target Date 05/11/21      PT SHORT TERM GOAL #2   Title report < or = to 6/10 Rt arm pain with reaching and lifting with daily tasks    Time 4    Period Weeks    Status New    Target Date 05/11/21      PT SHORT TERM GOAL #3   Title report > or = to 85% use of  the Rt UE with daily tasks    Time 4    Period Weeks    Status New    Target Date 05/11/21               PT Long Term Goals - 04/13/21 1509       PT LONG TERM GOAL #1   Title be independent in initial HEP    Baseline --    Time 8    Period Weeks    Status New    Target Date 06/08/21      PT LONG TERM GOAL #2   Title improve FOTO to > or = to 63    Baseline 44    Time 8    Period Weeks    Status New    Target Date 06/08/21      PT LONG TERM GOAL #3   Title report > or = to 95% use of Rt UE with ADLs and self-care due to reduced pain    Baseline 75%    Time 8    Period Weeks    Status New    Target Date 06/08/21      PT LONG TERM GOAL #4   Title demonstrate 4+/5 Rt shoulder strength to improve endurance with use    Baseline --    Time 8    Period Weeks    Status New    Target Date 06/08/21      PT LONG TERM GOAL #5   Title report < or = to 3/10 Rt shoulder pain with use    Baseline 9/10    Time 8    Period Weeks    Status New    Target Date 06/08/21                   Plan - 05/02/21 1543     Clinical Impression Statement Pt arrives with very mild Rt shoulder ache. Mainly pt reports feeling naseous today but feels she could   do supine exercises ok, which she did. Pt agreed to try the arm bike. She completed a few minutes very slowly and cautious.    Personal Factors and Comorbidities Comorbidity 2    Comorbidities fibromyalgia, anxiety    Examination-Participation Restrictions Cleaning;Driving;Shop    Stability/Clinical Decision Making Stable/Uncomplicated    Rehab Potential Good    PT Frequency 2x / week    PT Duration 8 weeks    PT Treatment/Interventions ADLs/Self Care Home Management;Moist Heat;Iontophoresis 4mg /ml Dexamethasone;Cryotherapy;Electrical Stimulation;Therapeutic activities;Therapeutic exercise;Neuromuscular re-education;Patient/family education;Manual techniques;Passive range of motion;Taping    PT Next Visit Plan add postural strength, review HEP, shoulder strength, DN to lats and posterior Rt shoulder    PT Home Exercise Plan Access Code: [email or web address]    Consulted and Agree with Plan of Care Patient             Patient will benefit from skilled therapeutic intervention in order to improve the following deficits and impairments:  Decreased activity tolerance, Impaired UE functional use, Pain, Postural dysfunction, Decreased strength, Improper body mechanics, Increased muscle spasms  Visit Diagnosis: Pain in right arm  Cramp and spasm     Problem List Patient Active Problem List   Diagnosis Date Noted   Gait abnormality 12/16/2018   Complaints of memory disturbance 12/16/2018   Myalgia and myositis 01/02/2014   Restless legs syndrome (RLS) 12/09/2013   Tremor 09/09/2013   Scoliosis 09/15/2011   MUCOCELE, 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   HYPERLIPIDEMIA 03/08/2010   HERPES ZOSTER  02/28/2010   VAGINAL BLEEDING, ABNORMAL 12/03/2009   LOOSE STOOLS 12/03/2009   UNSPECIFIED URINARY INCONTINENCE 12/03/2009   WEIGHT GAIN 10/08/2009   DRY SKIN 10/15/2008   INSOMNIA 10/15/2008   WEIGHT LOSS, RECENT 10/15/2008   VAGINAL PRURITUS 07/31/2008   SKIN TAG 06/26/2008   SEBORRHEIC KERATOSIS  06/26/2008   PRURITUS 05/28/2008   FATIGUE, CHRONIC 05/28/2008   DISTURBANCES OF SENSATION OF SMELL AND TASTE 05/28/2008   MASS, CHEST WALL 05/28/2008   ANEMIA-NOS 05/15/2008   ANXIETY 05/15/2008   DEPRESSION 05/15/2008    Heather Streeper, PTA 05/02/2021, 4:10 PM  [facility] @ [place], [place], [address] Phone: [phone]   Fax:  [phone]  Name: Soli Oravetz MRN: [id number] Date of Birth: July 26, 1955

## 2021-05-03 ENCOUNTER — Other Ambulatory Visit: Payer: Self-pay | Admitting: Family Medicine

## 2021-05-03 DIAGNOSIS — E2839 Other primary ovarian failure: Secondary | ICD-10-CM

## 2021-05-04 ENCOUNTER — Ambulatory Visit: Payer: Medicare Other | Attending: Family Medicine

## 2021-05-04 ENCOUNTER — Other Ambulatory Visit: Payer: Self-pay

## 2021-05-04 DIAGNOSIS — R252 Cramp and spasm: Secondary | ICD-10-CM | POA: Insufficient documentation

## 2021-05-04 DIAGNOSIS — M79601 Pain in right arm: Secondary | ICD-10-CM | POA: Insufficient documentation

## 2021-05-04 DIAGNOSIS — R3121 Asymptomatic microscopic hematuria: Secondary | ICD-10-CM | POA: Diagnosis not present

## 2021-05-04 NOTE — Therapy (Signed)
[facility] @ [place], [place], [address] Phone: [phone]   Fax:  [phone]  Physical Therapy Treatment  Patient Details  Name: Brandi Mora MRN: [id number] Date of Birth: Nov 10, 1955 Referring Provider (PT): Sela Hilding, MD   Encounter Date: 05/04/2021   PT End of Session - 05/04/21 1610     Visit Number 4    Date for PT Re-Evaluation 06/08/21    Authorization Type [employer] Medicare    Progress Note Due on Visit 10    PT Start Time 1548    PT Stop Time 1613    PT Time Calculation (min) 25 min    Activity Tolerance Patient tolerated treatment well;Other (comment)    Behavior During Therapy [facility] for tasks assessed/performed             Past Medical History:  Diagnosis Date   Anemia    Anxiety    Depression    Fibromyalgia    High cholesterol    Mildly obese    Myalgia and myositis 01/02/2014   Restless legs syndrome (RLS) 12/09/2013   Tremor 09/09/2013    Past Surgical History:  Procedure Laterality Date   COSMETIC SURGERY  1976   dermoid cyst removal  1990   OOPHORECTOMY     vagal nerve stimulator placement Left     There were no vitals filed for this visit.   Subjective Assessment - 05/04/21 1551     Subjective My Rt shoulder pain is about the same.  I am feeling better than last visit.    Patient Stated Goals improve use of Rt UE, sleep on Lt side    Currently in Pain? Yes    Pain Score 6     Pain Location Arm    Pain Orientation Right    Pain Descriptors / Indicators Aching    Pain Type Chronic pain    Pain Onset More than a month ago    Pain Frequency Intermittent    Aggravating Factors  reaching overhead, holding phone, lifting    Pain Relieving Factors not lifting or reaching, topical rub                               [facility] Adult PT Treatment/Exercise - 05/04/21 0001       Shoulder Exercises: Supine   External Rotation  Strengthening;Both;Theraband    Theraband Level (Shoulder External Rotation) Level 1 (Yellow)    Flexion Limitations chest press: 2# added to cane 2x10    Other Supine Exercises 1# flexion punches 10x2   Scap retraction 10x2 done in supine   Other Supine Exercises 1#, arm at 90 degrees F: arm circles 10x eac hdirection      Shoulder Exercises: ROM/Strengthening   UBE (Upper Arm Bike) L1 2x2 with PTA present to monitor pt    Other ROM/Strengthening Exercises cane flexion x 10                       PT Short Term Goals - 05/04/21 1553       PT SHORT TERM GOAL #1   Title be independent in initial HEP    Status Achieved      PT SHORT TERM GOAL #2   Title report < or = to 6/10 Rt arm pain with reaching and lifting with daily tasks    Baseline depends, limiting this motion  PT SHORT TERM GOAL #3   Title report > or = to 85% use of the Rt UE with daily tasks    Status On-going               PT Long Term Goals - 04/13/21 1509       PT LONG TERM GOAL #1   Title be independent in initial HEP    Baseline --    Time 8    Period Weeks    Status New    Target Date 06/08/21      PT LONG TERM GOAL #2   Title improve FOTO to > or = to 63    Baseline 44    Time 8    Period Weeks    Status New    Target Date 06/08/21      PT LONG TERM GOAL #3   Title report > or = to 95% use of Rt UE with ADLs and self-care due to reduced pain    Baseline 75%    Time 8    Period Weeks    Status New    Target Date 06/08/21      PT LONG TERM GOAL #4   Title demonstrate 4+/5 Rt shoulder strength to improve endurance with use    Baseline --    Time 8    Period Weeks    Status New    Target Date 06/08/21      PT LONG TERM GOAL #5   Title report < or = to 3/10 Rt shoulder pain with use    Baseline 9/10    Time 8    Period Weeks    Status New    Target Date 06/08/21                   Plan - 05/04/21 1553     Clinical Impression Statement Pt reports no  significant change in symptoms since the start of care. Pt reports that she continues to have increased Rt UE pain with reaching overhead and lifting and is limiting these motions due to pain. Pt did well with all exercise in the clinic today. Pt had shortened session due to being late and needing to leave for another appt.  Pt demonstrates functional strength deficits and will continue to benefit from skilled PT to address this to improve functional use.    Rehab Potential Good    PT Frequency 2x / week    PT Duration 8 weeks    PT Treatment/Interventions ADLs/Self Care Home Management;Moist Heat;Iontophoresis 4mg /ml Dexamethasone;Cryotherapy;Electrical Stimulation;Therapeutic activities;Therapeutic exercise;Neuromuscular re-education;Patient/family education;Manual techniques;Passive range of motion;Taping    PT Next Visit Plan add postural strength, review HEP, shoulder strength, DN to lats and posterior Rt shoulder    PT Home Exercise Plan Access Code: [email or web address]    Consulted and Agree with Plan of Care Patient             Patient will benefit from skilled therapeutic intervention in order to improve the following deficits and impairments:  Decreased activity tolerance, Impaired UE functional use, Pain, Postural dysfunction, Decreased strength, Improper body mechanics, Increased muscle spasms  Visit Diagnosis: Pain in right arm  Cramp and spasm     Problem List Patient Active Problem List   Diagnosis Date Noted   Gait abnormality 12/16/2018   Complaints of memory disturbance 12/16/2018   Myalgia and myositis 01/02/2014   Restless legs syndrome (RLS) 12/09/2013   Tremor 09/09/2013   Scoliosis 09/15/2011  MUCOCELE, 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   HYPERLIPIDEMIA 03/08/2010   HERPES ZOSTER 02/28/2010   VAGINAL BLEEDING, ABNORMAL 12/03/2009   LOOSE STOOLS 12/03/2009   UNSPECIFIED URINARY INCONTINENCE 12/03/2009   WEIGHT GAIN 10/08/2009   DRY SKIN 10/15/2008   INSOMNIA 10/15/2008    WEIGHT LOSS, RECENT 10/15/2008   VAGINAL PRURITUS 07/31/2008   SKIN TAG 06/26/2008   SEBORRHEIC KERATOSIS 06/26/2008   PRURITUS 05/28/2008   FATIGUE, CHRONIC 05/28/2008   DISTURBANCES OF SENSATION OF SMELL AND TASTE 05/28/2008   MASS, CHEST WALL 05/28/2008   ANEMIA-NOS 05/15/2008   ANXIETY 05/15/2008   DEPRESSION 05/15/2008   Sigurd Sos, PT 05/04/21 4:22 PM   [facility] @ [place], [place], [address] Phone: [phone]   Fax:  [phone]  Name: Idalys Convery MRN: [id number] Date of Birth: [birth year]

## 2021-05-09 ENCOUNTER — Ambulatory Visit: Payer: Medicare Other | Admitting: Physical Therapy

## 2021-05-09 ENCOUNTER — Encounter: Payer: Self-pay | Admitting: Physical Therapy

## 2021-05-09 ENCOUNTER — Other Ambulatory Visit: Payer: Self-pay

## 2021-05-09 DIAGNOSIS — R252 Cramp and spasm: Secondary | ICD-10-CM

## 2021-05-09 DIAGNOSIS — M79601 Pain in right arm: Secondary | ICD-10-CM | POA: Diagnosis not present

## 2021-05-09 NOTE — Therapy (Signed)
[facility] [facility] @ [place], [place], [address] Phone: [phone]   Fax:  [phone]  Physical Therapy Treatment  Patient Details  Name: Brandi Mora MRN: [id number] Date of Birth: 09/18/1955 Referring Provider (PT): Sela Hilding, MD   Encounter Date: 05/09/2021   PT End of Session - 05/09/21 1530     Visit Number 5    Date for PT Re-Evaluation 06/08/21    Authorization Type [employer] Medicare    Progress Note Due on Visit 10    PT Start Time 1530    PT Stop Time 1605    PT Time Calculation (min) 35 min    Activity Tolerance Patient tolerated treatment well;Other (comment)    Behavior During Therapy [facility] for tasks assessed/performed             Past Medical History:  Diagnosis Date   Anemia    Anxiety    Depression    Fibromyalgia    High cholesterol    Mildly obese    Myalgia and myositis 01/02/2014   Restless legs syndrome (RLS) 12/09/2013   Tremor 09/09/2013    Past Surgical History:  Procedure Laterality Date   COSMETIC SURGERY  1976   dermoid cyst removal  1990   OOPHORECTOMY     vagal nerve stimulator placement Left     There were no vitals filed for this visit.   Subjective Assessment - 05/09/21 1531     Subjective Rt shoulder is not too bad today.    Pertinent History fibromyalgia, depression, anxiety    Currently in Pain? Yes    Pain Score 2     Pain Location Shoulder    Pain Orientation Right    Pain Descriptors / Indicators Dull    Multiple Pain Sites No                               [facility] Adult PT Treatment/Exercise - 05/09/21 0001       Shoulder Exercises: Supine   Protraction Strengthening;Right;5 reps;Weights    Protraction Weight (lbs) 2    Horizontal ABduction Strengthening;Both;5 reps;Theraband    Theraband Level (Shoulder Horizontal ABduction) Level 2 (Red)    External Rotation Strengthening;Both;5 reps;Theraband    Theraband Level  (Shoulder External Rotation) Level 2 (Red)    Flexion Strengthening;Right;20 reps;Weights    Shoulder Flexion Weight (lbs) 2    Other Supine Exercises 1#, arm at 90 degrees F: arm circles 10x eac hdirection      Shoulder Exercises: Standing   Other Standing Exercises Cones to second shelf (6)      Shoulder Exercises: ROM/Strengthening   UBE (Upper Arm Bike) L 1.2 2x2 with PTA present                       PT Short Term Goals - 05/04/21 1553       PT SHORT TERM GOAL #1   Title be independent in initial HEP    Status Achieved      PT SHORT TERM GOAL #2   Title report < or = to 6/10 Rt arm pain with reaching and lifting with daily tasks    Baseline depends, limiting this motion      PT SHORT TERM GOAL #3   Title report > or = to 85% use of the Rt UE with daily tasks    Status On-going  PT Long Term Goals - 04/13/21 1509       PT LONG TERM GOAL #1   Title be independent in initial HEP    Baseline --    Time 8    Period Weeks    Status New    Target Date 06/08/21      PT LONG TERM GOAL #2   Title improve FOTO to > or = to 63    Baseline 44    Time 8    Period Weeks    Status New    Target Date 06/08/21      PT LONG TERM GOAL #3   Title report > or = to 95% use of Rt UE with ADLs and self-care due to reduced pain    Baseline 75%    Time 8    Period Weeks    Status New    Target Date 06/08/21      PT LONG TERM GOAL #4   Title demonstrate 4+/5 Rt shoulder strength to improve endurance with use    Baseline --    Time 8    Period Weeks    Status New    Target Date 06/08/21      PT LONG TERM GOAL #5   Title report < or = to 3/10 Rt shoulder pain with use    Baseline 9/10    Time 8    Period Weeks    Status New    Target Date 06/08/21                   Plan - 05/09/21 1608     Clinical Impression Statement pt arrives with mild Rt shoulder pain. Pt reports she can use her RT arm better around the house. Holding  her iphone to take pictures can be difficult. PTA discussed ways to reduce the load on her RTLE when taking pictures. Unsure of complete understanding. Pt had no pain with any TE today.    Personal Factors and Comorbidities Comorbidity 2    Comorbidities fibromyalgia, anxiety    Examination-Participation Restrictions Cleaning;Driving;Shop    Stability/Clinical Decision Making Stable/Uncomplicated    Rehab Potential Good    PT Frequency 2x / week    PT Duration 8 weeks    PT Treatment/Interventions ADLs/Self Care Home Management;Moist Heat;Iontophoresis 4mg /ml Dexamethasone;Cryotherapy;Electrical Stimulation;Therapeutic activities;Therapeutic exercise;Neuromuscular re-education;Patient/family education;Manual techniques;Passive range of motion;Taping    PT Next Visit Plan Give pt red band for HEP if no adverse effects from today.    PT Home Exercise Plan Access Code: [email or web address] and Agree with Plan of Care Patient             Patient will benefit from skilled therapeutic intervention in order to improve the following deficits and impairments:  Decreased activity tolerance, Impaired UE functional use, Pain, Postural dysfunction, Decreased strength, Improper body mechanics, Increased muscle spasms  Visit Diagnosis: Pain in right arm  Cramp and spasm     Problem List Patient Active Problem List   Diagnosis Date Noted   Gait abnormality 12/16/2018   Complaints of memory disturbance 12/16/2018   Myalgia and myositis 01/02/2014   Restless legs syndrome (RLS) 12/09/2013   Tremor 09/09/2013   Scoliosis 09/15/2011   MUCOCELE, 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   HYPERLIPIDEMIA 03/08/2010   HERPES ZOSTER 02/28/2010   VAGINAL BLEEDING, ABNORMAL 12/03/2009   LOOSE STOOLS 12/03/2009   UNSPECIFIED URINARY INCONTINENCE 12/03/2009   WEIGHT GAIN 10/08/2009   DRY SKIN 10/15/2008   INSOMNIA 10/15/2008  WEIGHT LOSS, RECENT 10/15/2008   VAGINAL PRURITUS 07/31/2008   SKIN TAG 06/26/2008    SEBORRHEIC KERATOSIS 06/26/2008   PRURITUS 05/28/2008   FATIGUE, CHRONIC 05/28/2008   DISTURBANCES OF SENSATION OF SMELL AND TASTE 05/28/2008   MASS, CHEST WALL 05/28/2008   ANEMIA-NOS 05/15/2008   ANXIETY 05/15/2008   DEPRESSION 05/15/2008    Keshawn Sundberg, PTA 05/09/2021, 4:11 PM  [facility] @ [place], [place], [address] Phone: [phone]   Fax:  [phone]  Name: Brandi Mora MRN: [id number] Date of Birth: [birth year]

## 2021-05-11 ENCOUNTER — Other Ambulatory Visit: Payer: Self-pay

## 2021-05-11 ENCOUNTER — Ambulatory Visit: Payer: Medicare Other

## 2021-05-11 DIAGNOSIS — R252 Cramp and spasm: Secondary | ICD-10-CM | POA: Diagnosis not present

## 2021-05-11 DIAGNOSIS — R319 Hematuria, unspecified: Secondary | ICD-10-CM | POA: Diagnosis not present

## 2021-05-11 DIAGNOSIS — M79601 Pain in right arm: Secondary | ICD-10-CM | POA: Diagnosis not present

## 2021-05-11 NOTE — Therapy (Signed)
[facility] @ [facility], [place], [address] Phone: [phone]   Fax:  [phone]  Physical Therapy Treatment  Patient Details  Name: Brandi Mora MRN: [id number] Date of Birth: 1955/12/30 Referring Provider (PT): Sela Hilding, MD   Encounter Date: 05/11/2021   PT End of Session - 05/11/21 1600     Visit Number 6    Date for PT Re-Evaluation 06/08/21    Authorization Type UHC Medicare    Progress Note Due on Visit 10    PT Start Time 1533    PT Stop Time 1558    PT Time Calculation (min) 25 min    Activity Tolerance Patient tolerated treatment well;Patient limited by fatigue;Other (comment)    Behavior During Therapy [facility] for tasks assessed/performed             Past Medical History:  Diagnosis Date   Anemia    Anxiety    Depression    Fibromyalgia    High cholesterol    Mildly obese    Myalgia and myositis 01/02/2014   Restless legs syndrome (RLS) 12/09/2013   Tremor 09/09/2013    Past Surgical History:  Procedure Laterality Date   COSMETIC SURGERY  1976   dermoid cyst removal  1990   OOPHORECTOMY     vagal nerve stimulator placement Left     There were no vitals filed for this visit.   Subjective Assessment - 05/11/21 1538     Subjective I am feeling nauseated again today.  Not sure why.  I'll see the MD soon to discuss.    Patient Stated Goals improve use of Rt UE, sleep on Lt side    Currently in Pain? Yes    Pain Score 3     Pain Location Shoulder    Pain Orientation Right                               [facility] Adult PT Treatment/Exercise - 05/11/21 0001       Shoulder Exercises: Supine   Protraction Strengthening;Right;5 reps;Weights    Protraction Weight (lbs) 2    Horizontal ABduction Strengthening;Both;5 reps;Theraband    Theraband Level (Shoulder Horizontal ABduction) Level 2 (Red)    External Rotation Strengthening;Both;5 reps;Theraband     Theraband Level (Shoulder External Rotation) Level 2 (Red)    Flexion Strengthening;Right;20 reps;Weights    Shoulder Flexion Weight (lbs) 2    Other Supine Exercises 1#, arm at 90 degrees F: arm circles 10x eac hdirection                     PT Education - 05/11/21 1546     Education Details Access Code: [email or web address]    Person(s) Educated Patient    Methods Explanation;Demonstration;Handout    Comprehension Returned demonstration;Verbalized understanding              PT Short Term Goals - 05/04/21 1553       PT SHORT TERM GOAL #1   Title be independent in initial HEP    Status Achieved      PT SHORT TERM GOAL #2   Title report < or = to 6/10 Rt arm pain with reaching and lifting with daily tasks    Baseline depends, limiting this motion      PT SHORT TERM GOAL #3   Title report > or = to 85% use of the Rt UE  with daily tasks    Status On-going               PT Long Term Goals - 04/13/21 1509       PT LONG TERM GOAL #1   Title be independent in initial HEP    Baseline --    Time 8    Period Weeks    Status New    Target Date 06/08/21      PT LONG TERM GOAL #2   Title improve FOTO to > or = to 63    Baseline 44    Time 8    Period Weeks    Status New    Target Date 06/08/21      PT LONG TERM GOAL #3   Title report > or = to 95% use of Rt UE with ADLs and self-care due to reduced pain    Baseline 75%    Time 8    Period Weeks    Status New    Target Date 06/08/21      PT LONG TERM GOAL #4   Title demonstrate 4+/5 Rt shoulder strength to improve endurance with use    Baseline --    Time 8    Period Weeks    Status New    Target Date 06/08/21      PT LONG TERM GOAL #5   Title report < or = to 3/10 Rt shoulder pain with use    Baseline 9/10    Time 8    Period Weeks    Status New    Target Date 06/08/21                   Plan - 05/11/21 1558     Clinical Impression Statement Pt arrived with nausea that she has been  experiencing consistency lately. Pt reports she can use her Rt arm better around the house and reports 40% improvement.  HEP advanced to include red theraband in supine.  Pt demonstrated fatigue with this.  Session was ended early as pt needed to leave for an appt.  PT will continue to benefit from skilled PT to address Rt shoulder pain, strength and functional use.    PT Frequency 2x / week    PT Duration 8 weeks    PT Treatment/Interventions ADLs/Self Care Home Management;Moist Heat;Iontophoresis 4mg /ml Dexamethasone;Cryotherapy;Electrical Stimulation;Therapeutic activities;Therapeutic exercise;Neuromuscular re-education;Patient/family education;Manual techniques;Passive range of motion;Taping    PT Next Visit Plan review theraband HEP, resume arm bike if pt is feeling better    PT Home Exercise Plan Access Code: [email or web address]    Consulted and Agree with Plan of Care Patient             Patient will benefit from skilled therapeutic intervention in order to improve the following deficits and impairments:  Decreased activity tolerance, Impaired UE functional use, Pain, Postural dysfunction, Decreased strength, Improper body mechanics, Increased muscle spasms  Visit Diagnosis: Pain in right arm  Cramp and spasm     Problem List Patient Active Problem List   Diagnosis Date Noted   Gait abnormality 12/16/2018   Complaints of memory disturbance 12/16/2018   Myalgia and myositis 01/02/2014   Restless legs syndrome (RLS) 12/09/2013   Tremor 09/09/2013   Scoliosis 09/15/2011   MUCOCELE, 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   HYPERLIPIDEMIA 03/08/2010   HERPES ZOSTER 02/28/2010   VAGINAL BLEEDING, ABNORMAL 12/03/2009   LOOSE STOOLS 12/03/2009   UNSPECIFIED URINARY INCONTINENCE 12/03/2009   WEIGHT GAIN 10/08/2009  DRY SKIN 10/15/2008   INSOMNIA 10/15/2008   WEIGHT LOSS, RECENT 10/15/2008   VAGINAL PRURITUS 07/31/2008   SKIN TAG 06/26/2008   SEBORRHEIC KERATOSIS 06/26/2008   PRURITUS 05/28/2008    FATIGUE, CHRONIC 05/28/2008   DISTURBANCES OF SENSATION OF SMELL AND TASTE 05/28/2008   MASS, CHEST WALL 05/28/2008   ANEMIA-NOS 05/15/2008   ANXIETY 05/15/2008   DEPRESSION 05/15/2008   Sigurd Sos, PT 05/11/21 4:02 PM  [facility] @ [place], [place], [address] Phone: [phone]   Fax:  [phone]  Name: Brandi Mora MRN: [id number] Date of Birth: [birth year]

## 2021-05-11 NOTE — Patient Instructions (Signed)
Access Code: [email or web address] URL: [email or web address] Date: 05/11/2021 Prepared by: Claiborne Billings   Supine Shoulder Horizontal Abduction with Resistance - 2 x daily - 7 x weekly - 1 sets - 10 reps Supine Bilateral Shoulder External Rotation with Resistance - 2 x daily - 7 x weekly - 1 sets - 10 reps

## 2021-05-16 ENCOUNTER — Ambulatory Visit: Payer: Medicare Other | Admitting: Physical Therapy

## 2021-05-16 ENCOUNTER — Encounter: Payer: Self-pay | Admitting: Physical Therapy

## 2021-05-16 ENCOUNTER — Other Ambulatory Visit: Payer: Self-pay

## 2021-05-16 DIAGNOSIS — M79601 Pain in right arm: Secondary | ICD-10-CM | POA: Diagnosis not present

## 2021-05-16 DIAGNOSIS — R252 Cramp and spasm: Secondary | ICD-10-CM | POA: Diagnosis not present

## 2021-05-16 NOTE — Therapy (Signed)
[facility] @ [facility] [address] [place], [place], [address] Phone: [phone]   Fax:  [phone]  Physical Therapy Treatment  Patient Details  Name: Brandi Mora MRN: [id number] Date of Birth: 12-26-1955 Referring Provider (PT): Garth Bigness, MD   Encounter Date: 05/16/2021   PT End of Session - 05/16/21 1544     Visit Number 7    Date for PT Re-Evaluation 06/08/21    Authorization Type UHC Medicare    Progress Note Due on Visit 10    PT Start Time 1543   pt late   Activity Tolerance Patient tolerated treatment well    Behavior During Therapy [facility] for tasks assessed/performed             Past Medical History:  Diagnosis Date   Anemia    Anxiety    Depression    Fibromyalgia    High cholesterol    Mildly obese    Myalgia and myositis 01/02/2014   Restless legs syndrome (RLS) 12/09/2013   Tremor 09/09/2013    Past Surgical History:  Procedure Laterality Date   COSMETIC SURGERY  1976   dermoid cyst removal  1990   OOPHORECTOMY     vagal nerve stimulator placement Left     There were no vitals filed for this visit.  Subjective: I am feeling better today. I am doing my band exercises today.   Pain; No current pain or complaints.   Treatment: 05/16/2021 Arm bike L1 3x3 with PTA present to monitor pt.  Supine Exs: Horizontal abd red band 10x,  ext rotation red band 10x, flexion punches 2# 2x10, protraction 2# 2x10, 2# arm circles 2x10 in each direction Standing exs: Cones to first shelf 1 min  Red band rows 10x, VC/TC for posture 1# on wrist flexion slides up wall 10x Seated exs: 1# lift to 80-90 degrees flexion 5x2    [facility] Adult PT Treatment/Exercise - 05/11/21 0001                Shoulder Exercises: Supine    Protraction Strengthening;Right;5 reps;Weights     Protraction Weight (lbs) 2     Horizontal ABduction Strengthening;Both;5 reps;Theraband     Theraband Level (Shoulder  Horizontal ABduction) Level 2 (Red)     External Rotation Strengthening;Both;5 reps;Theraband     Theraband Level (Shoulder External Rotation) Level 2 (Red)     Flexion Strengthening;Right;20 reps;Weights     Shoulder Flexion Weight (lbs) 2     Other Supine Exercises 1#, arm at 90 degrees F: arm circles 10x eac hdirection       [facility] Adult PT Treatment/Exercise - 05/09/21 0001                Shoulder Exercises: Supine    Protraction Strengthening;Right;5 reps;Weights     Protraction Weight (lbs) 2     Horizontal ABduction Strengthening;Both;5 reps;Theraband     Theraband Level (Shoulder Horizontal ABduction) Level 2 (Red)     External Rotation Strengthening;Both;5 reps;Theraband     Theraband Level (Shoulder External Rotation) Level 2 (Red)     Flexion Strengthening;Right;20 reps;Weights     Shoulder Flexion Weight (lbs) 2     Other Supine Exercises 1#, arm at 90 degrees F: arm circles 10x eac hdirection           Shoulder Exercises: Standing    Other Standing Exercises Cones to second shelf (6)          Shoulder Exercises: ROM/Strengthening  UBE (Upper Arm Bike) L 1.2 2x2 with PTA present                                  PT Short Term Goals - 05/04/21 1553       PT SHORT TERM GOAL #1   Title be independent in initial HEP    Status Achieved      PT SHORT TERM GOAL #2   Title report < or = to 6/10 Rt arm pain with reaching and lifting with daily tasks    Baseline depends, limiting this motion      PT SHORT TERM GOAL #3   Title report > or = to 85% use of the Rt UE with daily tasks    Status On-going               PT Long Term Goals - 04/13/21 1509       PT LONG TERM GOAL #1   Title be independent in initial HEP    Baseline --    Time 8    Period Weeks    Status New    Target Date 06/08/21      PT LONG TERM GOAL #2   Title improve FOTO to > or = to 63    Baseline 44    Time 8    Period Weeks    Status New    Target Date  06/08/21      PT LONG TERM GOAL #3   Title report > or = to 95% use of Rt UE with ADLs and self-care due to reduced pain    Baseline 75%    Time 8    Period Weeks    Status New    Target Date 06/08/21      PT LONG TERM GOAL #4   Title demonstrate 4+/5 Rt shoulder strength to improve endurance with use    Baseline --    Time 8    Period Weeks    Status New    Target Date 06/08/21      PT LONG TERM GOAL #5   Title report < or = to 3/10 Rt shoulder pain with use    Baseline 9/10    Time 8    Period Weeks    Status New    Target Date 06/08/21             Clinical Impression Statement Pt arrived with reports of feeling better since last session.  Pt reports she can use her Rt arm better around the house and reports 40% improvement. Pt reports doing red theraband in supine at home. Added sets and/or reps to todays exercises to promote strength. Pt demonstrates less fatigue with this.  PT will continue to benefit from skilled PT to address Rt shoulder pain, strength and functional use.      PT Frequency 2x / week     PT Duration 8 weeks     PT Treatment/Interventions ADLs/Self Care Home Management;Moist Heat;Iontophoresis 4mg /ml Dexamethasone;Cryotherapy;Electrical Stimulation;Therapeutic activities;Therapeutic exercise;Neuromuscular re-education;Patient/family education;Manual techniques;Passive range of motion;Taping     PT Next Visit Plan Continue with gentle Rt shoulder strength and endurance    PT Home Exercise Plan Access Code: [email or web address]     Consulted and Agree with Plan of Care Patient            Patient will benefit from skilled therapeutic intervention in order to improve the following  deficits and impairments:     Visit Diagnosis: Pain in right arm  Cramp and spasm     Problem List Patient Active Problem List   Diagnosis Date Noted   Gait abnormality 12/16/2018   Complaints of memory disturbance 12/16/2018   Myalgia and myositis 01/02/2014   Restless  legs syndrome (RLS) 12/09/2013   Tremor 09/09/2013   Scoliosis 09/15/2011   MUCOCELE, 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   HYPERLIPIDEMIA 03/08/2010   HERPES ZOSTER 02/28/2010   VAGINAL BLEEDING, ABNORMAL 12/03/2009   LOOSE STOOLS 12/03/2009   UNSPECIFIED URINARY INCONTINENCE 12/03/2009   WEIGHT GAIN 10/08/2009   DRY SKIN 10/15/2008   INSOMNIA 10/15/2008   WEIGHT LOSS, RECENT 10/15/2008   VAGINAL PRURITUS 07/31/2008   SKIN TAG 06/26/2008   SEBORRHEIC KERATOSIS 06/26/2008   PRURITUS 05/28/2008   FATIGUE, CHRONIC 05/28/2008   DISTURBANCES OF SENSATION OF SMELL AND TASTE 05/28/2008   MASS, CHEST WALL 05/28/2008   ANEMIA-NOS 05/15/2008   ANXIETY 05/15/2008   DEPRESSION 05/15/2008   Sigurd Sos, PT 05/16/21 3:46 PM  [facility] @ [place], [place], [address] Phone: [phone]   Fax:  [phone]  Name: Niamiah Jakob MRN: [id number] Date of Birth: 23-Jul-1955

## 2021-05-18 ENCOUNTER — Ambulatory Visit: Payer: Medicare Other

## 2021-05-18 ENCOUNTER — Other Ambulatory Visit: Payer: Self-pay

## 2021-05-18 DIAGNOSIS — M79601 Pain in right arm: Secondary | ICD-10-CM

## 2021-05-18 DIAGNOSIS — R252 Cramp and spasm: Secondary | ICD-10-CM

## 2021-05-18 NOTE — Therapy (Signed)
OUTPATIENT PHYSICAL THERAPY TREATMENT NOTE   Patient Name: Brandi Mora MRN: [id number] DOB:Jul 08, 1955, 66 y.o., female Today's Date: 05/18/2021  PCP: Glenis Smoker, MD REFERRING PROVIDER: Glenis Smoker, MD   PT End of Session - 05/18/21 1610     Visit Number 8    Date for PT Re-Evaluation 06/08/21    Authorization Type UHC Medicare    Progress Note Due on Visit 10    PT Start Time 1533    PT Stop Time 1607    PT Time Calculation (min) 34 min    Activity Tolerance Patient tolerated treatment well    Behavior During Therapy [facility] for tasks assessed/performed             Past Medical History:  Diagnosis Date   Anemia    Anxiety    Depression    Fibromyalgia    High cholesterol    Mildly obese    Myalgia and myositis 01/02/2014   Restless legs syndrome (RLS) 12/09/2013   Tremor 09/09/2013   Past Surgical History:  Procedure Laterality Date   COSMETIC SURGERY  1976   dermoid cyst removal  1990   OOPHORECTOMY     vagal nerve stimulator placement Left    Patient Active Problem List   Diagnosis Date Noted   Gait abnormality 12/16/2018   Complaints of memory disturbance 12/16/2018   Myalgia and myositis 01/02/2014   Restless legs syndrome (RLS) 12/09/2013   Tremor 09/09/2013   Scoliosis 09/15/2011   MUCOCELE, 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   HYPERLIPIDEMIA 03/08/2010   HERPES ZOSTER 02/28/2010   VAGINAL BLEEDING, ABNORMAL 12/03/2009   LOOSE STOOLS 12/03/2009   UNSPECIFIED URINARY INCONTINENCE 12/03/2009   WEIGHT GAIN 10/08/2009   DRY SKIN 10/15/2008   INSOMNIA 10/15/2008   WEIGHT LOSS, RECENT 10/15/2008   VAGINAL PRURITUS 07/31/2008   SKIN TAG 06/26/2008   SEBORRHEIC KERATOSIS 06/26/2008   PRURITUS 05/28/2008   FATIGUE, CHRONIC 05/28/2008   DISTURBANCES OF SENSATION OF SMELL AND TASTE 05/28/2008   MASS, CHEST WALL 05/28/2008   ANEMIA-NOS 05/15/2008   ANXIETY 05/15/2008   DEPRESSION 05/15/2008    REFERRING DIAG: Rt arm pain with  fibromyalgia   THERAPY DIAG:  Pain in right arm  Cramp and spasm  PERTINENT HISTORY: fibromyalgia, depression, anxiety   PRECAUTIONS: none  SUBJECTIVE: I'm having more soreness today and I'm not sure why. Overall, my Rt arm pain is less.  3-6/10 with use.  No longer getting to 9/10.   PAIN:  Are you having pain? Yes NPRS scale: 6/10 Pain location: Rt arm Pain orientation: Right  PAIN TYPE: aching and dull Pain description: intermittent  Aggravating factors: use of arm, activity  Relieving factors: rest  OBJECTIVE: 05/18/21 Rt shoulder flexion: 4+/5, abduction 4/5, IR 4+/5, ER 4/5  TODAY'S TREATMENT:  Treatment: 05/18/21 Arm bike L1.5x 2/2 with PT present to monitor pt.  Supine Exs: Horizontal abd red band 2x10,  ext rotation red band 2x10.  flexion punches 2# 2x10, protraction 2# 2x10, 2# arm circles 2x10 in each direction Standing exs:  Cones to first shelf: Rt UE 1 min  Red band rows 10x, VC/TC for posture 1# on wrist flexion slides up wall 10x Seated exs: 1# lift to 80-90 degrees flexion 5x2 Treatment: 05/16/2021 Arm bike L1 3x3 with PTA present to monitor pt.  Supine Exs: Horizontal abd red band 10x,  ext rotation red band 10x, flexion punches 2# 2x10, protraction 2# 2x10, 2# arm circles 2x10 in each direction Standing exs: Cones to 2nd  shelf 1 min  Red band rows x20 VC/TC for posture  Seated exs: 1# lift to 80-90 degrees flexion2x10   HOME EXERCISE PROGRAM: Access Code: [email or web address]   PT Short Term Goals - 05/18/21 1546       PT SHORT TERM GOAL #1   Title be independent in initial HEP    Status Achieved      PT SHORT TERM GOAL #2   Title report < or = to 6/10 Rt arm pain with reaching and lifting with daily tasks    Baseline 3-6/10    Status Achieved      PT SHORT TERM GOAL #3   Title report > or = to 85% use of the Rt UE with daily tasks    Baseline 60-75%    Status On-going              PT Long Term Goals - 05/18/21 1549       PT LONG  TERM GOAL #1   Title be independent in initial HEP    Status On-going      PT LONG TERM GOAL #3   Title report > or = to 95% use of Rt UE with ADLs and self-care due to reduced pain    Baseline 60-75%    Status On-going      PT LONG TERM GOAL #5   Title report < or = to 3/10 Rt shoulder pain with use    Baseline 3-6/10    Status On-going             ASSESSMENT:  CLINICAL IMPRESSION: Pt is making steady progress with PT.  Pt reports 3-6/10 Rt UE pain with use, down from 9/10 at evaluation.  Strength is also improved with MMT today.  Pt tolerates all exercise well today and was able to do a 2nd set of many of her exercises today.  Pt was monitored for technique today.  Pt will continue to benefit from skilled PT to address Rt UE strength and endurance and improved use with less pain.   PLAN: PT FREQUENCY: 2x/week  PT DURATION: 8 weeks  PLANNED INTERVENTIONS: Therapeutic exercises, Therapeutic activity, Neuro Muscular re-education, Balance training, Gait training, Patient/Family education, Joint mobilization, Dry Needling, Cryotherapy, Moist heat, Taping, and Manual therapy  PLAN FOR NEXT SESSION: Rt shoulder strength, endurance and flexibility to improve functional use    Sigurd Sos, PT 05/18/21 4:11 PM

## 2021-05-23 ENCOUNTER — Other Ambulatory Visit: Payer: Self-pay

## 2021-05-23 ENCOUNTER — Ambulatory Visit: Payer: Medicare Other

## 2021-05-23 DIAGNOSIS — M79601 Pain in right arm: Secondary | ICD-10-CM

## 2021-05-23 DIAGNOSIS — R252 Cramp and spasm: Secondary | ICD-10-CM

## 2021-05-23 NOTE — Therapy (Signed)
[facility] @ [facility] [address] [place], [place], [address] Phone: [phone]   Fax:  [phone]  Physical Therapy Treatment  Patient Details  Name: Brandi Mora MRN: [id number] Date of Birth: Apr 02, 1956 Referring Provider (PT): Garth Bigness, MD   Encounter Date: 05/23/2021   PT End of Session - 05/23/21 1550     Visit Number 9    Date for PT Re-Evaluation 06/08/21    Authorization Type UHC Medicare    Progress Note Due on Visit 9    PT Start Time 1549    PT Stop Time 1611    PT Time Calculation (min) 22 min    Activity Tolerance Patient tolerated treatment well    Behavior During Therapy [facility] for tasks assessed/performed             Past Medical History:  Diagnosis Date   Anemia    Anxiety    Depression    Fibromyalgia    High cholesterol    Mildly obese    Myalgia and myositis 01/02/2014   Restless legs syndrome (RLS) 12/09/2013   Tremor 09/09/2013    Past Surgical History:  Procedure Laterality Date   COSMETIC SURGERY  1976   dermoid cyst removal  1990   OOPHORECTOMY     vagal nerve stimulator placement Left     There were no vitals filed for this visit.   Subjective Assessment - 05/23/21 1550     Subjective Pt states that Rt UE pain is better, but it comes and goes. She denies any pain currently. It was bothering her last night after taking more pictures throughout the day. Taking a hot shower was helpful at reducing pain.    Patient Stated Goals improve use of Rt UE, sleep on Lt side    Currently in Pain? No/denies    Multiple Pain Sites No                               [facility] Adult PT Treatment/Exercise - 05/23/21 0001       Shoulder Exercises: Supine   Protraction Strengthening;Right;Weights;10 reps    Protraction Weight (lbs) 2    Horizontal ABduction Strengthening;Both;Theraband;10 reps    Theraband Level (Shoulder Horizontal ABduction) Level 2 (Red)     External Rotation Strengthening;Both;Theraband;10 reps    Theraband Level (Shoulder External Rotation) Level 2 (Red)    Flexion Strengthening;Right;20 reps;Weights    Shoulder Flexion Weight (lbs) 2    Other Supine Exercises 1#, arm at 90 degrees F: arm circles 10x eac hdirection      Shoulder Exercises: ROM/Strengthening   UBE (Upper Arm Bike) L 1.2 2x2 with PTA present                     PT Education - 05/23/21 1552     Education Details Access Code: [email or web address]    Person(s) Educated Patient    Methods Explanation;Demonstration    Comprehension Verbalized understanding              PT Short Term Goals - 05/18/21 1546       PT SHORT TERM GOAL #1   Title be independent in initial HEP    Status Achieved      PT SHORT TERM GOAL #2   Title report < or = to 6/10 Rt arm pain with reaching and lifting with daily tasks    Baseline 3-6/10  Status Achieved      PT SHORT TERM GOAL #3   Title report > or = to 85% use of the Rt UE with daily tasks    Baseline 60-75%    Status On-going               PT Long Term Goals - 05/18/21 1549       PT LONG TERM GOAL #1   Title be independent in initial HEP    Status On-going      PT LONG TERM GOAL #3   Title report > or = to 95% use of Rt UE with ADLs and self-care due to reduced pain    Baseline 60-75%    Status On-going      PT LONG TERM GOAL #5   Title report < or = to 3/10 Rt shoulder pain with use    Baseline 3-6/10    Status On-going                   Plan - 05/23/21 1552     Clinical Impression Statement Pt continues to demonstrate improvement with consistently lowered pain levels and less frequent episodes of pain. No new exercise progressions this session due to late arrival; she was able to progress repetitions to many exercises while maintaining appropriate form with frequent verbal cues. She reported mild increase in Rt shoulder discomfort after second set of supine ER; instructed to  provide less resistance when performing at home. She did have moderate fatigue and required splitting repetitions into sets. She will continue to benefit from skilled PT intervention in order to address Rt shoulder pain, strength, and functional use.    PT Treatment/Interventions ADLs/Self Care Home Management;Moist Heat;Iontophoresis 4mg /ml Dexamethasone;Cryotherapy;Electrical Stimulation;Therapeutic activities;Therapeutic exercise;Neuromuscular re-education;Patient/family education;Manual techniques;Passive range of motion;Taping    PT Next Visit Plan Continue to progress strengthening activities to tolerance; conisder progression to seated/standing exercises.    PT Home Exercise Plan Access Code: [email or web address]    Consulted and Agree with Plan of Care Patient             Patient will benefit from skilled therapeutic intervention in order to improve the following deficits and impairments:  Decreased activity tolerance, Impaired UE functional use, Pain, Postural dysfunction, Decreased strength, Improper body mechanics, Increased muscle spasms  Visit Diagnosis: Pain in right arm  Cramp and spasm     Problem List Patient Active Problem List   Diagnosis Date Noted   Gait abnormality 12/16/2018   Complaints of memory disturbance 12/16/2018   Myalgia and myositis 01/02/2014   Restless legs syndrome (RLS) 12/09/2013   Tremor 09/09/2013   Scoliosis 09/15/2011   MUCOCELE, 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   HYPERLIPIDEMIA 03/08/2010   HERPES ZOSTER 02/28/2010   VAGINAL BLEEDING, ABNORMAL 12/03/2009   LOOSE STOOLS 12/03/2009   UNSPECIFIED URINARY INCONTINENCE 12/03/2009   WEIGHT GAIN 10/08/2009   DRY SKIN 10/15/2008   INSOMNIA 10/15/2008   WEIGHT LOSS, RECENT 10/15/2008   VAGINAL PRURITUS 07/31/2008   SKIN TAG 06/26/2008   SEBORRHEIC KERATOSIS 06/26/2008   PRURITUS 05/28/2008   FATIGUE, CHRONIC 05/28/2008   DISTURBANCES OF SENSATION OF SMELL AND TASTE 05/28/2008   MASS, CHEST WALL 05/28/2008    ANEMIA-NOS 05/15/2008   ANXIETY 05/15/2008   DEPRESSION 05/15/2008    Heather Roberts, PT, DPT02/20/234:59 PM   [facility] @ [place], [place], [address] Phone: [phone]   Fax:  [phone]  Name: Kamalpreet Seman MRN: [id number] Date of  Birth: 1955-07-03

## 2021-05-30 ENCOUNTER — Encounter: Payer: Commercial Managed Care - HMO | Admitting: Physical Therapy

## 2021-06-01 ENCOUNTER — Other Ambulatory Visit: Payer: Self-pay

## 2021-06-01 ENCOUNTER — Ambulatory Visit: Payer: Medicare Other | Attending: Family Medicine

## 2021-06-01 DIAGNOSIS — M79601 Pain in right arm: Secondary | ICD-10-CM | POA: Insufficient documentation

## 2021-06-01 DIAGNOSIS — R252 Cramp and spasm: Secondary | ICD-10-CM | POA: Diagnosis not present

## 2021-06-01 DIAGNOSIS — M6281 Muscle weakness (generalized): Secondary | ICD-10-CM | POA: Insufficient documentation

## 2021-06-01 NOTE — Therapy (Signed)
?OUTPATIENT PHYSICAL THERAPY TREATMENT NOTE ? ? ?Patient Name: Brandi Mora ?MRN: [id number] ?DOB:September 13, 1955, 66 y.o., female ?Today's Date: 06/01/2021 ? ?PCP: Glenis Smoker, MD ?REFERRING PROVIDER: Glenis Smoker, MD ?Progress Note ?Reporting Period 04/13/21 to 06/01/21 ? ?See note below for Objective Data and Assessment of Progress/Goals.  ? ?  ? PT End of Session - 06/01/21 1615   ? ? Visit Number 10   ? Date for PT Re-Evaluation 07/13/21   ? Authorization Type [employer] Medicare   ? Progress Note Due on Visit 11   ? PT Start Time 1541   ? PT Stop Time 1615   ? PT Time Calculation (min) 34 min   ? Activity Tolerance Patient tolerated treatment well   ? Behavior During Therapy [facility] for tasks assessed/performed   ? ?  ?  ? ?  ? ? ? ?Past Medical History:  ?Diagnosis Date  ? Anemia   ? Anxiety   ? Depression   ? Fibromyalgia   ? High cholesterol   ? Mildly obese   ? Myalgia and myositis 01/02/2014  ? Restless legs syndrome (RLS) 12/09/2013  ? Tremor 09/09/2013  ? ?Past Surgical History:  ?Procedure Laterality Date  ? COSMETIC SURGERY  1976  ? dermoid cyst removal  1990  ? OOPHORECTOMY    ? vagal nerve stimulator placement Left   ? ?Patient Active Problem List  ? Diagnosis Date Noted  ? Gait abnormality 12/16/2018  ? Complaints of memory disturbance 12/16/2018  ? Myalgia and myositis 01/02/2014  ? Restless legs syndrome (RLS) 12/09/2013  ? Tremor 09/09/2013  ? Scoliosis 09/15/2011  ? MUCOCELE, 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  ? HYPERLIPIDEMIA 03/08/2010  ? HERPES ZOSTER 02/28/2010  ? VAGINAL BLEEDING, ABNORMAL 12/03/2009  ? LOOSE STOOLS 12/03/2009  ? UNSPECIFIED URINARY INCONTINENCE 12/03/2009  ? WEIGHT GAIN 10/08/2009  ? DRY SKIN 10/15/2008  ? INSOMNIA 10/15/2008  ? WEIGHT LOSS, RECENT 10/15/2008  ? VAGINAL PRURITUS 07/31/2008  ? SKIN TAG 06/26/2008  ? SEBORRHEIC KERATOSIS 06/26/2008  ? PRURITUS 05/28/2008  ? FATIGUE, CHRONIC 05/28/2008  ? DISTURBANCES OF SENSATION OF SMELL AND TASTE 05/28/2008  ? MASS, CHEST WALL  05/28/2008  ? ANEMIA-NOS 05/15/2008  ? ANXIETY 05/15/2008  ? DEPRESSION 05/15/2008  ? ? ?REFERRING DIAG: Rt arm pain with fibromyalgia  ? ?THERAPY DIAG:  ?Pain in right arm - Plan: PT plan of care cert/re-cert ? ?Cramp and spasm - Plan: PT plan of care cert/re-cert ? ?Muscle weakness (generalized) - Plan: PT plan of care cert/re-cert ? ?PERTINENT HISTORY: fibromyalgia, depression, anxiety  ? ?PRECAUTIONS: none ? ?SUBJECTIVE: I'm getting better.  I use my Rt UE 75% of normal.    ? ?PAIN:  ?Are you having pain? Yes ?NPRS scale: 2/10  (6-7/10 with use) ?Pain location: Rt arm ?Pain orientation: Right  ?PAIN TYPE: aching and dull ?Pain description: intermittent  ?Aggravating factors: use of arm, activity  ?Relieving factors: rest ? ?OBJECTIVE:  ? ?05/18/21 ?Rt shoulder flexion: 4+/5, abduction 4/5, IR 4+/5, ER 4/5 ?06/01/21 ?Rt shoulder flexion: 4+/5, abduction 4/5, IR 4+/5, ER 4/5 ?FOTO: 44 (goal is 70) ? ?TODAY'S TREATMENT:  ?06/01/21: ?Arm bike L1.5x 2/2 with PT present to monitor pt.  ?Yellow theraband: horizontal abduction 2x10 ?ER with band x10 ? ?Standing exs:  ?Seated exs: ?3 way raises: 1# 2x10 each on Rt ?Horizontal abduction: yellow band 2x10 ?Finger ladder: 2# added to Rt UE x10 reps to the top.  ? ? ?05/23/21 ?[facility] Adult PT Treatment/Exercise - 05/23/21 0001   ?  ?    ?     ?  Shoulder Exercises: Supine  ?  Protraction Strengthening;Right;Weights;10 reps   ?  Protraction Weight (lbs) 2   ?  Horizontal ABduction Strengthening;Both;Theraband;10 reps   ?  Theraband Level (Shoulder Horizontal ABduction) Level 2 (Red)   ?  External Rotation Strengthening;Both;Theraband;10 reps   ?  Theraband Level (Shoulder External Rotation) Level 2 (Red)   ?  Flexion Strengthening;Right;20 reps;Weights   ?  Shoulder Flexion Weight (lbs) 2   ?  Other Supine Exercises 1#, arm at 90 degrees F: arm circles 10x eac hdirection   ?     ?  Shoulder Exercises: ROM/Strengthening  ?  UBE (Upper Arm Bike) L 1.2 2x2 with PTA present   ?   ? ?Treatment: 05/18/21 ?Arm bike L1.5x 2/2 with PT present to monitor pt.  ?Supine Exs: ?Horizontal abd red band 2x10,  ext rotation red band 2x10.  flexion punches 2# 2x10, protraction 2# 2x10, 2# arm circles 2x10 in each direction ?Standing exs:  ?Cones to first shelf: Rt UE 1 min  ?Red band rows 10x, VC/TC for posture ?1# on wrist flexion slides up wall 10x ?Seated exs: ?1# lift to 80-90 degrees flexion 5x2 ? ? ?HOME EXERCISE PROGRAM: ?Access Code: [email or web address] ? ? ? ?Goals reviewed with patient? Yes ? ?SHORT TERM GOALS: ? ?STG Name Target Date Goal status  ?1 be independent in initial HEP  ?Baseline:   MET  ?2 report < or = to 6/10 Rt arm pain with reaching and lifting with daily tasks  ?Baseline: 6-7/10 06/22/21 IN PROGRESS  ?3 report > or = to 85% use of the Rt UE with daily tasks  ?Baseline: 75%  06/22/2021 IN PROGRESS  ?     ?     ?     ?     ? ?LONG TERM GOALS:  ? ?LTG Name Target Date Goal status  ?1 be independent in initial HEP  ?Baseline: 07/13/2021 IN PROGRESS  ?2 improve FOTO to > or = to 63  ?Baseline: 07/13/2021 IN PROGRESS  ?3 report > or = to 95% use of Rt UE with ADLs and self-care due to reduced pain  ?Baseline:  75%  07/13/2021 IN PROGRESS  ?4 demonstrate 4+/5 Rt shoulder strength to improve endurance with use  ?Baseline:  Rt shoulder flexion: 4+/5, abduction 4/5, IR 4+/5, ER 4/5 07/13/2021 IN PROGRESS  ?5 report < or = to 3/10 Rt shoulder pain with use  ?Baseline: 6-7/10  07/13/2021 IN PROGRESS  ?     ?     ? ? ? ? ? ? ? ?ASSESSMENT: ? ?CLINICAL IMPRESSION: Pt is making steady progress with PT and reports 75% use of Rt UE with home tasks.   FOTO improved to 44, moving toward goal.  Pt reports 3-7/10 Rt UE pain with use, down from 9/10 at evaluation.  Strength is also improved in the Rt UE and pt is able to use weight against gravity with exercises today.    Pt was monitored for technique today.  Pt will continue to benefit from skilled PT to address Rt UE strength and endurance and improved use with  less pain.  ? ?PLAN: ?PT FREQUENCY: 2x/week ? ?PT DURATION: 6 weeks  ? ?PLANNED INTERVENTIONS: Therapeutic exercises, Therapeutic activity, Neuro Muscular re-education, Balance training, Gait training, Patient/Family education, Joint mobilization, Dry Needling, Cryotherapy, Moist heat, Taping, and Manual therapy ? ?PLAN FOR NEXT SESSION: Rt shoulder strength, endurance and flexibility to improve functional use  ? ? ?Sigurd Sos, PT ?06/01/21 4:16 PM  ? ?   ?

## 2021-06-06 ENCOUNTER — Telehealth: Payer: Self-pay | Admitting: Physical Therapy

## 2021-06-06 ENCOUNTER — Other Ambulatory Visit: Payer: Self-pay

## 2021-06-06 ENCOUNTER — Ambulatory Visit: Payer: Medicare Other | Admitting: Physical Therapy

## 2021-06-06 ENCOUNTER — Encounter: Payer: Self-pay | Admitting: Physical Therapy

## 2021-06-06 DIAGNOSIS — R252 Cramp and spasm: Secondary | ICD-10-CM | POA: Diagnosis not present

## 2021-06-06 DIAGNOSIS — M79601 Pain in right arm: Secondary | ICD-10-CM | POA: Diagnosis not present

## 2021-06-06 DIAGNOSIS — M6281 Muscle weakness (generalized): Secondary | ICD-10-CM

## 2021-06-06 NOTE — Telephone Encounter (Signed)
PTA called for no show. Left message to call office.  ?Ane Payment, PTA ?@[email or web address] @ 3:46 PM  ?

## 2021-06-06 NOTE — Therapy (Addendum)
?OUTPATIENT PHYSICAL THERAPY TREATMENT NOTE ? ? ?Patient Name: Brandi Mora ?MRN: [id number] ?DOB:1956/01/01, 66 y.o., female ?Today's Date: 06/06/2021 ? ?PCP: Glenis Smoker, MD ?REFERRING PROVIDER: Glenis Smoker, MD ?  ? PT End of Session - 06/06/21 1553   ? ? Visit Number 11   ? Date for PT Re-Evaluation 07/13/21   ? Authorization Type [employer] Medicare   ? Progress Note Due on Visit 11   ? PT Start Time 1550   ? Activity Tolerance Patient tolerated treatment well   ? Behavior During Therapy [facility] for tasks assessed/performed   ? ?  ?  ? ?  ? ? ? ?Past Medical History:  ?Diagnosis Date  ? Anemia   ? Anxiety   ? Depression   ? Fibromyalgia   ? High cholesterol   ? Mildly obese   ? Myalgia and myositis 01/02/2014  ? Restless legs syndrome (RLS) 12/09/2013  ? Tremor 09/09/2013  ? ?Past Surgical History:  ?Procedure Laterality Date  ? COSMETIC SURGERY  1976  ? dermoid cyst removal  1990  ? OOPHORECTOMY    ? vagal nerve stimulator placement Left   ? ?Patient Active Problem List  ? Diagnosis Date Noted  ? Gait abnormality 12/16/2018  ? Complaints of memory disturbance 12/16/2018  ? Myalgia and myositis 01/02/2014  ? Restless legs syndrome (RLS) 12/09/2013  ? Tremor 09/09/2013  ? Scoliosis 09/15/2011  ? MUCOCELE, 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  ? HYPERLIPIDEMIA 03/08/2010  ? HERPES ZOSTER 02/28/2010  ? VAGINAL BLEEDING, ABNORMAL 12/03/2009  ? LOOSE STOOLS 12/03/2009  ? UNSPECIFIED URINARY INCONTINENCE 12/03/2009  ? WEIGHT GAIN 10/08/2009  ? DRY SKIN 10/15/2008  ? INSOMNIA 10/15/2008  ? WEIGHT LOSS, RECENT 10/15/2008  ? VAGINAL PRURITUS 07/31/2008  ? SKIN TAG 06/26/2008  ? SEBORRHEIC KERATOSIS 06/26/2008  ? PRURITUS 05/28/2008  ? FATIGUE, CHRONIC 05/28/2008  ? DISTURBANCES OF SENSATION OF SMELL AND TASTE 05/28/2008  ? MASS, CHEST WALL 05/28/2008  ? ANEMIA-NOS 05/15/2008  ? ANXIETY 05/15/2008  ? DEPRESSION 05/15/2008  ? ? ?REFERRING DIAG: Rt arm pain with fibromyalgia  ? ?THERAPY DIAG:  ?Pain in right arm ? ?Cramp and  spasm ? ?Muscle weakness (generalized) ? ?PERTINENT HISTORY: fibromyalgia, depression, anxiety  ? ?PRECAUTIONS: none ? ?SUBJECTIVE: I'm getting better.  I use my Rt UE 75% of normal.    ? ?PAIN:  ?Are you having pain? No ?NPRS scale:  ?Pain location:  ?Pain orientation: Right  ?PAIN TYPE:  ?Pain description: intermittent  ?Aggravating factors: use of arm, activity  ?Relieving factors: rest ? ?OBJECTIVE:  ? ?05/18/21 ?Rt shoulder flexion: 4+/5, abduction 4/5, IR 4+/5, ER 4/5 ?06/01/21 ?Rt shoulder flexion: 4+/5, abduction 4/5, IR 4+/5, ER 4/5 ?FOTO: 44 (goal is 2) ? ?TODAY'S TREATMENT:  ?Todays Treatment; 06/06/21: Pt 25 min late, session shortened ? Seated 3 way raise 1# 10x each ?Seated  red band horizontal abd 10x ?Seated red band shoulder ER 10x ?Seated bicep curls 2# 10x ?Finger ladder 2# RTUE 10x  ?Arm bike L 1.6 2x2 with PTA present to monitor  ? ?06/01/21: ?Arm bike L1.5x 2/2 with PT present to monitor pt.  ?Yellow theraband: horizontal abduction 2x10 ?ER with band x10 ? ?Standing exs:  ?Seated exs: ?3 way raises: 1# 2x10 each on Rt ?Horizontal abduction: yellow band 2x10 ?Finger ladder: 2# added to Rt UE x10 reps to the top.  ? ? ?05/23/21 ?[facility] Adult PT Treatment/Exercise - 05/23/21 0001   ?  ?    ?     ?  Shoulder Exercises: Supine  ?  Protraction Strengthening;Right;Weights;10 reps   ?  Protraction Weight (lbs) 2   ?  Horizontal ABduction Strengthening;Both;Theraband;10 reps   ?  Theraband Level (Shoulder Horizontal ABduction) Level 2 (Red)   ?  External Rotation Strengthening;Both;Theraband;10 reps   ?  Theraband Level (Shoulder External Rotation) Level 2 (Red)   ?  Flexion Strengthening;Right;20 reps;Weights   ?  Shoulder Flexion Weight (lbs) 2   ?  Other Supine Exercises 1#, arm at 90 degrees F: arm circles 10x eac hdirection   ?     ?  Shoulder Exercises: ROM/Strengthening  ?  UBE (Upper Arm Bike) L 1.2 2x2 with PTA present   ?  ? ?Treatment: 05/18/21 ?Arm bike L1.5x 2/2 with PT present to monitor pt.   ?Supine Exs: ?Horizontal abd red band 2x10,  ext rotation red band 2x10.  flexion punches 2# 2x10, protraction 2# 2x10, 2# arm circles 2x10 in each direction ?Standing exs:  ?Cones to first shelf: Rt UE 1 min  ?Red band rows 10x, VC/TC for posture ?1# on wrist flexion slides up wall 10x ?Seated exs: ?1# lift to 80-90 degrees flexion 5x2 ? ? ?HOME EXERCISE PROGRAM: ?Access Code: [email or web address] ? ? ? ?Goals reviewed with patient? Yes ? ?SHORT TERM GOALS: ? ?STG Name Target Date Goal status  ?1 be independent in initial HEP  ?Baseline:   MET  ?2 report < or = to 6/10 Rt arm pain with reaching and lifting with daily tasks  ?Baseline: 6-7/10 06/22/21 IN PROGRESS  ?3 report > or = to 85% use of the Rt UE with daily tasks  ?Baseline: 75%  06/27/2021 IN PROGRESS  ?     ?     ?     ?     ? ?LONG TERM GOALS:  ? ?LTG Name Target Date Goal status  ?1 be independent in initial HEP  ?Baseline: 07/18/2021 IN PROGRESS  ?2 improve FOTO to > or = to 63  ?Baseline: 07/18/2021 IN PROGRESS  ?3 report > or = to 95% use of Rt UE with ADLs and self-care due to reduced pain  ?Baseline:  75%  07/18/2021 IN PROGRESS  ?4 demonstrate 4+/5 Rt shoulder strength to improve endurance with use  ?Baseline:  Rt shoulder flexion: 4+/5, abduction 4/5, IR 4+/5, ER 4/5 07/18/2021 IN PROGRESS  ?5 report < or = to 3/10 Rt shoulder pain with use  ?Baseline: 6-7/10  07/18/2021 IN PROGRESS  ?     ?     ? ? ? ? ? ? ? ?ASSESSMENT: ? ?CLINICAL IMPRESSION: Pt arrives to PT 25 min late today which shortened her session.  Pt arrives pain free and reports she can now take pictures with her phone and her Rt shoulder does not hurt too much that prevents her from taking the picture. Pt continues to tolerate light resistive work for Bil shoulders.  ? ?PLAN: ?PT FREQUENCY: 2x/week ? ?PT DURATION: 6 weeks  ? ?PLANNED INTERVENTIONS: Therapeutic exercises, Therapeutic activity, Neuro Muscular re-education, Balance training, Gait training, Patient/Family education, Joint mobilization,  Dry Needling, Cryotherapy, Moist heat, Taping, and Manual therapy ? ?PLAN FOR NEXT SESSION: Rt shoulder strength, endurance and flexibility to improve functional use  ? ? ?Myrene Galas, PTA ?06/06/21 4:08 PM  ?06/06/21 3:54 PM  ? ?   ?

## 2021-06-07 ENCOUNTER — Telehealth: Payer: Self-pay | Admitting: Neurology

## 2021-06-07 ENCOUNTER — Ambulatory Visit (INDEPENDENT_AMBULATORY_CARE_PROVIDER_SITE_OTHER): Payer: Medicare Other | Admitting: Neurology

## 2021-06-07 ENCOUNTER — Encounter: Payer: Self-pay | Admitting: Neurology

## 2021-06-07 VITALS — BP 163/78 | HR 68 | Ht 67.0 in | Wt 130.0 lb

## 2021-06-07 DIAGNOSIS — R112 Nausea with vomiting, unspecified: Secondary | ICD-10-CM | POA: Insufficient documentation

## 2021-06-07 DIAGNOSIS — R319 Hematuria, unspecified: Secondary | ICD-10-CM | POA: Insufficient documentation

## 2021-06-07 DIAGNOSIS — D72820 Lymphocytosis (symptomatic): Secondary | ICD-10-CM | POA: Insufficient documentation

## 2021-06-07 DIAGNOSIS — K269 Duodenal ulcer, unspecified as acute or chronic, without hemorrhage or perforation: Secondary | ICD-10-CM | POA: Insufficient documentation

## 2021-06-07 DIAGNOSIS — E559 Vitamin D deficiency, unspecified: Secondary | ICD-10-CM | POA: Insufficient documentation

## 2021-06-07 DIAGNOSIS — R42 Dizziness and giddiness: Secondary | ICD-10-CM

## 2021-06-07 DIAGNOSIS — R110 Nausea: Secondary | ICD-10-CM

## 2021-06-07 DIAGNOSIS — M797 Fibromyalgia: Secondary | ICD-10-CM | POA: Insufficient documentation

## 2021-06-07 DIAGNOSIS — F331 Major depressive disorder, recurrent, moderate: Secondary | ICD-10-CM | POA: Insufficient documentation

## 2021-06-07 DIAGNOSIS — M1990 Unspecified osteoarthritis, unspecified site: Secondary | ICD-10-CM | POA: Insufficient documentation

## 2021-06-07 DIAGNOSIS — E2839 Other primary ovarian failure: Secondary | ICD-10-CM | POA: Insufficient documentation

## 2021-06-07 DIAGNOSIS — G9332 Myalgic encephalomyelitis/chronic fatigue syndrome: Secondary | ICD-10-CM | POA: Insufficient documentation

## 2021-06-07 DIAGNOSIS — G5603 Carpal tunnel syndrome, bilateral upper limbs: Secondary | ICD-10-CM | POA: Insufficient documentation

## 2021-06-07 DIAGNOSIS — R400 Somnolence: Secondary | ICD-10-CM | POA: Insufficient documentation

## 2021-06-07 DIAGNOSIS — Z1211 Encounter for screening for malignant neoplasm of colon: Secondary | ICD-10-CM | POA: Insufficient documentation

## 2021-06-07 NOTE — Progress Notes (Signed)
? ?Chief Complaint  ?Patient presents with  ? Follow-up  ?  Rm 15. Alone. PCP is Dr. Anastasia Pall. [place]. ?Pt states battery is not working on VNS. ?Pt c/o difficulty with vision, especially with motion. She c/o dizziness. She states dramamine and aspirin help with nausea. Nausea usually occurs during the beginning of the day.  ? ? ? ? ?ASSESSMENT AND PLAN ? ?Brandi Mora is a 66 y.o. female   ?Long history of depression anxiety ?Polypharmacy treatment, ?Worsening early morning nausea, motions sensitivity, ? Normal neurological examinations, ? 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 Discussed with patient, she wants further evaluation, will proceed with MRI of her brain, eeg ? ? ? ?DIAGNOSTIC DATA (LABS, IMAGING, TESTING) ?- I reviewed patient records, labs, notes, testing and imaging myself where available. ? ? ?MEDICAL HISTORY: ? ?Brandi Mora is a 66 year old female, patient of Dr. Jannifer Franklin in the past, her primary care physician is Dr. Lindell Noe, Anastasia Pall, MD  ? ?I reviewed and summarized the referring note.  Medical history ?Depression ?Fibromyalgia ?Chronic insomnia ? ?Patient was seen by Dr. Jannifer Franklin since 2015, reported long history of severe depression, anxiety, treated with different neurotrophic medications, previously taking Abilify, Cogentin, Topamax, was seen for constellation of complaints, including bilateral hands tremor, fibromyalgia ? ?Most recent visit with Dr. Jannifer Franklin was in March 2021, complains of irregular sleep pattern, generalized fatigue, ? ?Personally reviewed CT head without contrast in October 2020, evidence of out of proportion bilateral frontal atrophy, mild progression compared to previous scan in 2016 ? ?She is now on polypharmacy treatment, including BuSpar 30 mg twice a day, Prozac 60 mg, lamotrigine 200 mg every day, also taking Requip 0.251/2 tablets daily for restless leg symptoms, patient reported that" her mind is in a good  place" ? ?Today her main complaint is couple years history of gradual onset motion sensitivity, she could not tolerate seeing people sitting on the rocking chair, she felt dizzy lightheaded, rapid change in the TV screen would cause her similar sensation, she has nauseated, sometimes vomiting in the morning time, gradually getting worse, now it bothers her every day, she is taking frequent over-the-counter Dramamine, which has helped her some ? ? ?PHYSICAL EXAM: ?  ?Vitals:  ? 06/07/21 1342  ?BP: (!) 163/78  ?Pulse: 68  ?Weight: 130 lb (59 kg)  ?Height: 5\' 7"  (1.702 m)  ? ?Not recorded ?  ? ? ?Body mass index is 20.36 kg/m?. ? ?PHYSICAL EXAMNIATION: ? ?Gen: NAD, conversant, well nourised, well groomed                     ?Cardiovascular: Regular rate rhythm, no peripheral edema, warm, nontender. ?Eyes: Conjunctivae clear without exudates or hemorrhage ?Neck: Supple, no carotid bruits. ?Pulmonary: Clear to auscultation bilaterally  ? ?NEUROLOGICAL EXAM: ? ?MENTAL STATUS: ?Speech: ?   Speech is normal; fluent and spontaneous with normal comprehension.  ?Cognition: ?    Orientation to time, place and person ?    Normal recent and remote memory ?    Normal Attention span and concentration ?    Normal Language, naming, repeating,spontaneous speech ?    Fund of knowledge ?  ?CRANIAL NERVES: ?CN II: Visual fields are full to confrontation. Pupils are round equal and briskly reactive to light. ?CN III, IV, VI: extraocular movement are normal. No ptosis. ?CN V: Facial sensation is intact to light touch ?CN VII: Face is symmetric with normal eye closure  ?CN VIII: Hearing  is normal to causal conversation. ?CN IX, X: Phonation is normal. ?CN XI: Head turning and shoulder shrug are intact ? ?MOTOR: ?There is no pronator drift of out-stretched arms. Muscle bulk and tone are normal. Muscle strength is normal. ? ?REFLEXES: ?Reflexes are 2+ and symmetric at the biceps, triceps, knees, and ankles. Plantar responses are  flexor. ? ?SENSORY: ?Intact to light touch, pinprick and vibratory sensation are intact in fingers and toes. ? ?COORDINATION: ?There is no trunk or limb dysmetria noted. ? ?GAIT/STANCE: ?Posture is normal. Gait is steady with normal steps, base, arm swing, and turning. Heel and toe walking are normal. Tandem gait is normal.  ?Romberg is absent. ? ?REVIEW OF SYSTEMS:  ?Full 14 system review of systems performed and notable only for as above ?All other review of systems were negative. ? ? ?ALLERGIES: ?Allergies  ?Allergen Reactions  ? Elavil [Amitriptyline Hcl] Nausea And Vomiting, Nausea Only and Other (See Comments)  ?  Dizziness;crying  ? Wellbutrin [Bupropion Hcl] Other (See Comments)  ?  Confusion  ? ? ?HOME MEDICATIONS: ?Current Outpatient Medications  ?Medication Sig Dispense Refill  ? Acetaminophen (TYLENOL PO) Take by mouth.    ? Apoaequorin (PREVAGEN PO) Take 1 tablet by mouth daily.    ? ASPIRIN PO Take 1 tablet by mouth as needed.    ? busPIRone (BUSPAR) 30 MG tablet Take 1 tablet (30 mg total) by mouth 2 (two) times daily. 60 tablet 3  ? caffeine 200 MG TABS tablet Take 200 mg by mouth every 4 (four) hours as needed.    ? dimenhyDRINATE (DRAMAMINE) 50 MG tablet Take 50 mg by mouth every 8 (eight) hours as needed.    ? FLUoxetine (PROZAC) 20 MG capsule Take 3 capsules (60 mg total) by mouth daily. 90 capsule 3  ? lamoTRIgine (LAMICTAL) 200 MG tablet Take 1 tablet (200 mg total) by mouth daily. Take 1/2 tablet in the morning and 1 tablet in the evening 45 tablet 3  ? Multiple Vitamins-Minerals (CENTRUM ADULTS PO) Take 1 tablet by mouth daily.    ? rOPINIRole (REQUIP) 0.25 MG tablet Take 0.5 mg by mouth 3 (three) times daily.     ? ?No current facility-administered medications for this visit.  ? ? ?PAST MEDICAL HISTORY: ?Past Medical History:  ?Diagnosis Date  ? Anemia   ? Anxiety   ? Depression   ? Fibromyalgia   ? High cholesterol   ? Mildly obese   ? Myalgia and myositis 01/02/2014  ? Restless legs  syndrome (RLS) 12/09/2013  ? Tremor 09/09/2013  ? ? ?PAST SURGICAL HISTORY: ?Past Surgical History:  ?Procedure Laterality Date  ? COSMETIC SURGERY  1976  ? dermoid cyst removal  1990  ? OOPHORECTOMY    ? vagal nerve stimulator placement Left   ? ? ?FAMILY HISTORY: ?Family History  ?Problem Relation Age of Onset  ? Heart disease Mother   ?     enlarged heart  ? Hypertension Mother   ? Cancer Mother   ?     ovarian  ? Cancer Father   ?     Prostate/skin  ? Diabetes Father   ?     type 2  ? Alzheimer's disease Father   ? Cancer Maternal Grandfather   ?     prostate   ? Breast cancer Paternal Aunt   ? Tremor Neg Hx   ? ? ?SOCIAL HISTORY: ?Social History  ? ?Socioeconomic History  ? Marital status: Single  ?  Spouse name: Not  on file  ? Number of children: 0  ? Years of education: college  ? Highest education level: Master's degree (e.g., MA, MS, MEng, MEd, MSW, MBA)  ?Occupational History  ? Occupation: unemployed  ?  Employer: DISABLED  ?Tobacco Use  ? Smoking status: Never  ? Smokeless tobacco: Never  ?Vaping Use  ? Vaping Use: Never used  ?Substance and Sexual Activity  ? Alcohol use: Yes  ?  Comment: rarely in small amounts  ? Drug use: No  ? Sexual activity: Not on file  ?Other Topics Concern  ? Not on file  ?Social History Narrative  ? Patient is single and lives alone.  ? Patient does not have any children.  ? Patient is on disability.  ? Patient is right-handed.  ? Patient take Jet Alert, which has caffeine.  ? Exercise - 3 times /wk for 3 hours doing water aerobics  ? 100-200 mg of caffeine daily (takes pills)   ? ?Social Determinants of Health  ? ?Financial Resource Strain: Not on file  ?Food Insecurity: Not on file  ?Transportation Needs: Not on file  ?Physical Activity: Not on file  ?Stress: Not on file  ?Social Connections: Not on file  ?Intimate Partner Violence: Not on file  ? ? ? ? ?Marcial Pacas, M.D. Ph.D. ? ?[facility] ?[address], [address] ?[place], [place] [address] ?Ph: [phone])  [phone] ?Fax: [phone] ? ?CC:  Maurice Small, MD ?[address] ?[address] ?[place],  [place] [address]  Glenis Smoker, MD   ?

## 2021-06-07 NOTE — Telephone Encounter (Signed)
[employer] medicare/medicaid order sent to [facility], [facility] they will reach out to the patient to schedule.  ?

## 2021-06-08 ENCOUNTER — Other Ambulatory Visit: Payer: Self-pay

## 2021-06-08 ENCOUNTER — Ambulatory Visit: Payer: Medicare Other

## 2021-06-08 DIAGNOSIS — M6281 Muscle weakness (generalized): Secondary | ICD-10-CM

## 2021-06-08 DIAGNOSIS — M79601 Pain in right arm: Secondary | ICD-10-CM | POA: Diagnosis not present

## 2021-06-08 DIAGNOSIS — R252 Cramp and spasm: Secondary | ICD-10-CM | POA: Diagnosis not present

## 2021-06-08 NOTE — Therapy (Signed)
?OUTPATIENT PHYSICAL THERAPY TREATMENT NOTE ? ? ?Patient Name: Brandi Mora ?MRN: [id number] ?DOB:1955-04-27, 66 y.o., female ?Today's Date: 06/08/2021 ? ?PCP: Glenis Smoker, MD ?REFERRING PROVIDER: Glenis Smoker, MD ?  ? PT End of Session - 06/08/21 1527   ? ? Visit Number 12   ? Date for PT Re-Evaluation 07/13/21   ? Authorization Type [employer] Medicare   ? PT Start Time 1501   ? PT Stop Time 1529   ? PT Time Calculation (min) 28 min   ? Activity Tolerance Patient tolerated treatment well   ? Behavior During Therapy [facility] for tasks assessed/performed   ? ?  ?  ? ?  ? ? ? ? ?Past Medical History:  ?Diagnosis Date  ? Anemia   ? Anxiety   ? Depression   ? Fibromyalgia   ? High cholesterol   ? Mildly obese   ? Myalgia and myositis 01/02/2014  ? Restless legs syndrome (RLS) 12/09/2013  ? Tremor 09/09/2013  ? ?Past Surgical History:  ?Procedure Laterality Date  ? COSMETIC SURGERY  1976  ? dermoid cyst removal  1990  ? OOPHORECTOMY    ? vagal nerve stimulator placement Left   ? ?Patient Active Problem List  ? Diagnosis Date Noted  ? Arthritis 06/07/2021  ? Bilateral carpal tunnel syndrome 06/07/2021  ? Chronic fatigue syndrome 06/07/2021  ? Fibromyalgia 06/07/2021  ? Colon cancer screening 06/07/2021  ? Daytime somnolence 06/07/2021  ? Decreased estrogen level 06/07/2021  ? Duodenal ulcer 06/07/2021  ? 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 Lymphocytosis 06/07/2021  ? Moderate recurrent major depression ([place]) 06/07/2021  ? Nausea and vomiting 06/07/2021  ? Vitamin D deficiency 06/07/2021  ? Nausea 06/07/2021  ? Dizziness 06/07/2021  ? Gait abnormality 12/16/2018  ? Complaints of memory disturbance 12/16/2018  ? Myalgia and myositis 01/02/2014  ? Restless legs syndrome (RLS) 12/09/2013  ? Tremor 09/09/2013  ? Scoliosis 09/15/2011  ? MUCOCELE, 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  ? HYPERLIPIDEMIA 03/08/2010  ? HERPES ZOSTER 02/28/2010  ? VAGINAL BLEEDING, ABNORMAL 12/03/2009  ? LOOSE STOOLS 12/03/2009  ? UNSPECIFIED URINARY  INCONTINENCE 12/03/2009  ? WEIGHT GAIN 10/08/2009  ? DRY SKIN 10/15/2008  ? INSOMNIA 10/15/2008  ? WEIGHT LOSS, RECENT 10/15/2008  ? VAGINAL PRURITUS 07/31/2008  ? SKIN TAG 06/26/2008  ? SEBORRHEIC KERATOSIS 06/26/2008  ? PRURITUS 05/28/2008  ? FATIGUE, CHRONIC 05/28/2008  ? DISTURBANCES OF SENSATION OF SMELL AND TASTE 05/28/2008  ? MASS, CHEST WALL 05/28/2008  ? ANEMIA-NOS 05/15/2008  ? ANXIETY 05/15/2008  ? DEPRESSION 05/15/2008  ? ? ?REFERRING DIAG: Rt arm pain with fibromyalgia  ? ?THERAPY DIAG:  ?Pain in right arm ? ?Cramp and spasm ? ?Muscle weakness (generalized) ? ?PERTINENT HISTORY: fibromyalgia, depression, anxiety  ? ?PRECAUTIONS: none ? ?SUBJECTIVE: I'm getting better.  I use my Rt UE 75% of normal.    ? ?PAIN:  ?Are you having pain? Yes ?NPRS scale: 6/10 ?Pain location: shoulder  ?Pain orientation: Right  ?PAIN TYPE: sore, aching ?Pain description: intermittent  ?Aggravating factors: holding phone, reaching overhead ?Relieving factors: rest ? ?OBJECTIVE:  ? ?05/18/21 ?Rt shoulder flexion: 4+/5, abduction 4/5, IR 4+/5, ER 4/5 ?06/01/21 ?Rt shoulder flexion: 4+/5, abduction 4/5, IR 4+/5, ER 4/5 ?FOTO: 44 (goal is 19) ? ?TODAY'S TREATMENT:  ?Todays Treatment; 06/08/21: Pt 20 min late, session shortened ? Seated flexion and abduction 1# 2x10  ?Rowing: 10# 2x10 ?Wall push-ups 2x10 ?Seated bicep curls 2# 10x ?Arm bike L 1.6 6 min (3/3) with PT present to monitor  ? ?  Todays Treatment; 06/06/21: Pt 25 min late, session shortened ? Seated 3 way raise 1# 10x each ?Seated  red band horizontal abd 10x ?Seated red band shoulder ER 10x ?Seated bicep curls 2# 10x ?Finger ladder 2# RTUE 10x  ?Arm bike L 1.6 2x2 with PTA present to monitor  ? ?06/01/21: ?Arm bike L1.5x 2/2 with PT present to monitor pt.  ?Yellow theraband: horizontal abduction 2x10 ?ER with band x10 ? ?Standing exs:  ?Seated exs: ?3 way raises: 1# 2x10 each on Rt ?Horizontal abduction: yellow band 2x10 ?Finger ladder: 2# added to Rt UE x10 reps to the top.   ? ?HOME EXERCISE PROGRAM: ?Access Code: [email or web address] ? ?Goals reviewed with patient? Yes ? ?SHORT TERM GOALS: ? ?STG Name Target Date Goal status  ?1 be independent in initial HEP  ?Baseline:   MET  ?2 report < or = to 6/10 Rt arm pain with reaching and lifting with daily tasks  ?Baseline: 6-7/10 06/22/21 IN PROGRESS  ?3 report > or = to 85% use of the Rt UE with daily tasks  ?Baseline: 75%  06/29/2021 IN PROGRESS  ?     ?     ?     ?     ? ?LONG TERM GOALS:  ? ?LTG Name Target Date Goal status  ?1 be independent in initial HEP  ?Baseline: 07/20/2021 IN PROGRESS  ?2 improve FOTO to > or = to 63  ?Baseline: 07/20/2021 IN PROGRESS  ?3 report > or = to 95% use of Rt UE with ADLs and self-care due to reduced pain  ?Baseline:  75%  07/20/2021 IN PROGRESS  ?4 demonstrate 4+/5 Rt shoulder strength to improve endurance with use  ?Baseline:  Rt shoulder flexion: 4+/5, abduction 4/5, IR 4+/5, ER 4/5 07/20/2021 IN PROGRESS  ?5 report < or = to 3/10 Rt shoulder pain with use  ?Baseline: 6-7/10  07/20/2021 IN PROGRESS  ?     ?     ? ? ? ? ? ? ? ?ASSESSMENT: ? ?CLINICAL IMPRESSION: Pt arrives to PT 20 min late today which shortened her session.  Pt with 6/10 pain in the Rt shoulder today but feels better overall. Pt reports she can now take pictures with her phone and her Rt shoulder does not hurt too much that prevents her from taking the picture. Pt was able to tolerate all exercise in the clinic today including advancement of exercise and time on arm bike,  and required minor tactile cues for technique.  Pt continues to tolerate light resistive work for Bil shoulders.   Pt will continue to benefit from skilled PT to address Rt shoulder strength and endurance.  ? ?PLAN: ?PT FREQUENCY: 2x/week ? ?PT DURATION: 6 weeks  ? ?PLANNED INTERVENTIONS: Therapeutic exercises, Therapeutic activity, Neuro Muscular re-education, Balance training, Gait training, Patient/Family education, Joint mobilization, Dry Needling, Cryotherapy, Moist heat,  Taping, and Manual therapy ? ?PLAN FOR NEXT SESSION: Rt shoulder strength, endurance and flexibility to improve functional use  ? ? ?Sigurd Sos, PT ?06/08/21 3:28 PM  ?   ?

## 2021-06-13 ENCOUNTER — Ambulatory Visit: Payer: Medicare Other | Admitting: Physical Therapy

## 2021-06-13 ENCOUNTER — Other Ambulatory Visit: Payer: Self-pay

## 2021-06-13 ENCOUNTER — Encounter: Payer: Self-pay | Admitting: Physical Therapy

## 2021-06-13 DIAGNOSIS — M6281 Muscle weakness (generalized): Secondary | ICD-10-CM | POA: Diagnosis not present

## 2021-06-13 DIAGNOSIS — R252 Cramp and spasm: Secondary | ICD-10-CM

## 2021-06-13 DIAGNOSIS — M79601 Pain in right arm: Secondary | ICD-10-CM

## 2021-06-13 NOTE — Therapy (Signed)
?OUTPATIENT PHYSICAL THERAPY TREATMENT NOTE ? ? ?Patient Name: Brandi Mora ?MRN: [id number] ?DOB:1955-09-16, 66 y.o., female ?Today's Date: 06/13/2021 ? ?PCP: Glenis Smoker, MD ?REFERRING PROVIDER: Glenis Smoker, MD ?  ? PT End of Session - 06/13/21 1547   ? ? Visit Number 13   ? Date for PT Re-Evaluation 07/13/21   ? Authorization Type [employer] Medicare   ? Progress Note Due on Visit 11   ? PT Start Time 6389   pt late  ? PT Stop Time 1615   ? PT Time Calculation (min) 31 min   ? Activity Tolerance Patient tolerated treatment well   ? Behavior During Therapy [facility] for tasks assessed/performed   ? ?  ?  ? ?  ? ? ? ? ?Past Medical History:  ?Diagnosis Date  ? Anemia   ? Anxiety   ? Depression   ? Fibromyalgia   ? High cholesterol   ? Mildly obese   ? Myalgia and myositis 01/02/2014  ? Restless legs syndrome (RLS) 12/09/2013  ? Tremor 09/09/2013  ? ?Past Surgical History:  ?Procedure Laterality Date  ? COSMETIC SURGERY  1976  ? dermoid cyst removal  1990  ? OOPHORECTOMY    ? vagal nerve stimulator placement Left   ? ?Patient Active Problem List  ? Diagnosis Date Noted  ? Arthritis 06/07/2021  ? Bilateral carpal tunnel syndrome 06/07/2021  ? Chronic fatigue syndrome 06/07/2021  ? Fibromyalgia 06/07/2021  ? Colon cancer screening 06/07/2021  ? Daytime somnolence 06/07/2021  ? Decreased estrogen level 06/07/2021  ? Duodenal ulcer 06/07/2021  ? 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 Lymphocytosis 06/07/2021  ? Moderate recurrent major depression ([place]) 06/07/2021  ? Nausea and vomiting 06/07/2021  ? Vitamin D deficiency 06/07/2021  ? Nausea 06/07/2021  ? Dizziness 06/07/2021  ? Gait abnormality 12/16/2018  ? Complaints of memory disturbance 12/16/2018  ? Myalgia and myositis 01/02/2014  ? Restless legs syndrome (RLS) 12/09/2013  ? Tremor 09/09/2013  ? Scoliosis 09/15/2011  ? MUCOCELE, 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  ? HYPERLIPIDEMIA 03/08/2010  ? HERPES ZOSTER 02/28/2010  ? VAGINAL BLEEDING, ABNORMAL 12/03/2009  ? LOOSE  STOOLS 12/03/2009  ? UNSPECIFIED URINARY INCONTINENCE 12/03/2009  ? WEIGHT GAIN 10/08/2009  ? DRY SKIN 10/15/2008  ? INSOMNIA 10/15/2008  ? WEIGHT LOSS, RECENT 10/15/2008  ? VAGINAL PRURITUS 07/31/2008  ? SKIN TAG 06/26/2008  ? SEBORRHEIC KERATOSIS 06/26/2008  ? PRURITUS 05/28/2008  ? FATIGUE, CHRONIC 05/28/2008  ? DISTURBANCES OF SENSATION OF SMELL AND TASTE 05/28/2008  ? MASS, CHEST WALL 05/28/2008  ? ANEMIA-NOS 05/15/2008  ? ANXIETY 05/15/2008  ? DEPRESSION 05/15/2008  ? ? ?REFERRING DIAG: Rt arm pain with fibromyalgia  ? ?THERAPY DIAG:  ?Pain in right arm ? ?Cramp and spasm ? ?Muscle weakness (generalized) ? ?PERTINENT HISTORY: fibromyalgia, depression, anxiety  ? ?PRECAUTIONS: none ? ?SUBJECTIVE: I'm getting better.  I use my Rt UE 75% of normal.    ? ?PAIN:  ?Are you having pain? no ?NPRS scale:  ?Pain location:  ?Pain orientation:  ?PAIN TYPE:  ?Pain description: Aggravating factors: holding phone, reaching overhead ?Relieving factors: rest ? ?OBJECTIVE:  ? ?05/18/21 ?Rt shoulder flexion: 4+/5, abduction 4/5, IR 4+/5, ER 4/5 ?06/01/21 ?Rt shoulder flexion: 4+/5, abduction 4/5, IR 4+/5, ER 4/5 ?FOTO: 44 (goal is 27) ? ?TODAY'S TREATMENT:  ?06/13/21 ?Arm Bike L1.5 3x3 , pt required small rest beak at 2 min mark.  ?Seated biceps 2# 2x10 ?2# flexion to 90 degrees 10x, scaption 10x 2# ?Red band horizontal abd 2x10 ?  Wall push ups 2x10 ?Finger ladder 2# 10x to the height of pt's tolerance ?Carry 10# 15 ft with Bil UE 4x ? ?Todays Treatment; 06/08/21: Pt 20 min late, session shortened ? Seated flexion and abduction 1# 2x10  ?Rowing: 10# 2x10 ?Wall push-ups 2x10 ?Seated bicep curls 2# 10x ?Arm bike L 1.6 6 min (3/3) with PT present to monitor  ? ?Todays Treatment; 06/06/21: Pt 25 min late, session shortened ? Seated 3 way raise 1# 10x each ?Seated  red band horizontal abd 10x ?Seated red band shoulder ER 10x ?Seated bicep curls 2# 10x ?Finger ladder 2# RTUE 10x  ?Arm bike L 1.6 2x2 with PTA present to monitor   ? ?06/01/21: ?Arm bike L1.5x 2/2 with PT present to monitor pt.  ?Yellow theraband: horizontal abduction 2x10 ?ER with band x10 ? ?Standing exs:  ?Seated exs: ?3 way raises: 1# 2x10 each on Rt ?Horizontal abduction: yellow band 2x10 ?Finger ladder: 2# added to Rt UE x10 reps to the top.  ? ?HOME EXERCISE PROGRAM: ?Access Code: [email or web address] ? ?Goals reviewed with patient? Yes ? ?SHORT TERM GOALS: ? ?STG Name Target Date Goal status  ?1 be independent in initial HEP  ?Baseline:   MET  ?2 report < or = to 6/10 Rt arm pain with reaching and lifting with daily tasks  ?Baseline: 6-7/10 06/22/21 IN PROGRESS  ?3 report > or = to 85% use of the Rt UE with daily tasks  ?Baseline: 75%  07/04/2021 IN PROGRESS  ?     ?     ?     ?     ? ?LONG TERM GOALS:  ? ?LTG Name Target Date Goal status  ?1 be independent in initial HEP  ?Baseline: 07/25/2021 IN PROGRESS  ?2 improve FOTO to > or = to 63  ?Baseline: 07/25/2021 IN PROGRESS  ?3 report > or = to 95% use of Rt UE with ADLs and self-care due to reduced pain  ?Baseline:  75%  07/25/2021 IN PROGRESS  ?4 demonstrate 4+/5 Rt shoulder strength to improve endurance with use  ?Baseline:  Rt shoulder flexion: 4+/5, abduction 4/5, IR 4+/5, ER 4/5 07/25/2021 IN PROGRESS  ?5 report < or = to 3/10 Rt shoulder pain with use  ?Baseline: 6-7/10  07/25/2021 IN PROGRESS  ?     ?     ? ? ? ? ? ? ? ?ASSESSMENT: ? ?CLINICAL IMPRESSION: Pt was able to tolerate all exercise in the clinic today including advancement of exercise and time on arm bike,  and required minor tactile cues for technique.  Pt continues to tolerate light resistive work for Bil shoulders.   Pt will continue to benefit from skilled PT to address Rt shoulder strength and endurance.  ? ?PLAN: ?PT FREQUENCY: 2x/week ? ?PT DURATION: 6 weeks  ? ?PLANNED INTERVENTIONS: Therapeutic exercises, Therapeutic activity, Neuro Muscular re-education, Balance training, Gait training, Patient/Family education, Joint mobilization, Dry Needling, Cryotherapy,  Moist heat, Taping, and Manual therapy ? ?PLAN FOR NEXT SESSION: Rt shoulder strength, endurance and flexibility to improve functional use. Pt has 4 appts left. ? ?Myrene Galas, PTA ?06/13/21 4:15 PM  ? ? ?Sigurd Sos, PT ?06/13/21 4:15 PM  ?   ?

## 2021-06-15 ENCOUNTER — Other Ambulatory Visit: Payer: Self-pay

## 2021-06-15 ENCOUNTER — Ambulatory Visit: Payer: Medicare Other

## 2021-06-15 DIAGNOSIS — M6281 Muscle weakness (generalized): Secondary | ICD-10-CM | POA: Diagnosis not present

## 2021-06-15 DIAGNOSIS — M79601 Pain in right arm: Secondary | ICD-10-CM | POA: Diagnosis not present

## 2021-06-15 DIAGNOSIS — R252 Cramp and spasm: Secondary | ICD-10-CM

## 2021-06-15 NOTE — Therapy (Signed)
?OUTPATIENT PHYSICAL THERAPY TREATMENT NOTE ? ? ?Patient Name: Brandi Mora ?MRN: [id number] ?DOB:February 18, 1956, 66 y.o., female ?Today's Date: 06/15/2021 ? ?PCP: Glenis Smoker, MD ?REFERRING PROVIDER: Glenis Smoker, MD ?  ? PT End of Session - 06/15/21 1613   ? ? Visit Number 14   ? Date for PT Re-Evaluation 07/13/21   ? Authorization Type [employer] Medicare   ? PT Start Time 3646   late  ? PT Stop Time 8032   ? PT Time Calculation (min) 31 min   ? Activity Tolerance Patient tolerated treatment well   ? Behavior During Therapy [facility] for tasks assessed/performed   ? ?  ?  ? ?  ? ? ? ? ? ?Past Medical History:  ?Diagnosis Date  ? Anemia   ? Anxiety   ? Depression   ? Fibromyalgia   ? High cholesterol   ? Mildly obese   ? Myalgia and myositis 01/02/2014  ? Restless legs syndrome (RLS) 12/09/2013  ? Tremor 09/09/2013  ? ?Past Surgical History:  ?Procedure Laterality Date  ? COSMETIC SURGERY  1976  ? dermoid cyst removal  1990  ? OOPHORECTOMY    ? vagal nerve stimulator placement Left   ? ?Patient Active Problem List  ? Diagnosis Date Noted  ? Arthritis 06/07/2021  ? Bilateral carpal tunnel syndrome 06/07/2021  ? Chronic fatigue syndrome 06/07/2021  ? Fibromyalgia 06/07/2021  ? Colon cancer screening 06/07/2021  ? Daytime somnolence 06/07/2021  ? Decreased estrogen level 06/07/2021  ? Duodenal ulcer 06/07/2021  ? 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 Lymphocytosis 06/07/2021  ? Moderate recurrent major depression ([place]) 06/07/2021  ? Nausea and vomiting 06/07/2021  ? Vitamin D deficiency 06/07/2021  ? Nausea 06/07/2021  ? Dizziness 06/07/2021  ? Gait abnormality 12/16/2018  ? Complaints of memory disturbance 12/16/2018  ? Myalgia and myositis 01/02/2014  ? Restless legs syndrome (RLS) 12/09/2013  ? Tremor 09/09/2013  ? Scoliosis 09/15/2011  ? MUCOCELE, 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  ? HYPERLIPIDEMIA 03/08/2010  ? HERPES ZOSTER 02/28/2010  ? VAGINAL BLEEDING, ABNORMAL 12/03/2009  ? LOOSE STOOLS 12/03/2009  ? UNSPECIFIED  URINARY INCONTINENCE 12/03/2009  ? WEIGHT GAIN 10/08/2009  ? DRY SKIN 10/15/2008  ? INSOMNIA 10/15/2008  ? WEIGHT LOSS, RECENT 10/15/2008  ? VAGINAL PRURITUS 07/31/2008  ? SKIN TAG 06/26/2008  ? SEBORRHEIC KERATOSIS 06/26/2008  ? PRURITUS 05/28/2008  ? FATIGUE, CHRONIC 05/28/2008  ? DISTURBANCES OF SENSATION OF SMELL AND TASTE 05/28/2008  ? MASS, CHEST WALL 05/28/2008  ? ANEMIA-NOS 05/15/2008  ? ANXIETY 05/15/2008  ? DEPRESSION 05/15/2008  ? ? ?REFERRING DIAG: Rt arm pain with fibromyalgia  ? ?THERAPY DIAG:  ?Pain in right arm ? ?Cramp and spasm ? ?Muscle weakness (generalized) ? ?PERTINENT HISTORY: fibromyalgia, depression, anxiety  ? ?PRECAUTIONS: none ? ?SUBJECTIVE: I think I staggering due to a combination of my medications.   I use my Rt UE 75% of normal and I'm getting stronger. ? ?PAIN:  ?Are you having pain? no ?NPRS scale:  ?Pain location:  ?Pain orientation:  ?PAIN TYPE:  ?Pain description: Aggravating factors: holding phone, reaching overhead ?Relieving factors: rest ? ?OBJECTIVE:  ? ?05/18/21 ?Rt shoulder flexion: 4+/5, abduction 4/5, IR 4+/5, ER 4/5 ?06/01/21 ?Rt shoulder flexion: 4+/5, abduction 4/5, IR 4+/5, ER 4/5 ?FOTO: 44 (goal is 34) ? ?TODAY'S TREATMENT:  ?06/15/21 ?Arm Bike L2 2/2 , pt required small rest beak at 2 min mark.  ?Seated biceps 10#  holding each end of the bell with each hand 2x10 ?2# flexion to  90 degrees 10x, scaption 10x 2# ?Red band horizontal abd 2x10 ?Wall push ups 2x10 ?Finger ladder 2# 10x to the height of pt's tolerance ?Ball rolls on the wall: Rt CW and CCW 2x10 each- tactile  ?06/13/21 ?Arm Bike L1.5 3x3 , pt required small rest beak at 2 min mark.  ?Seated biceps 2# 2x10 ?2# flexion to 90 degrees 10x, scaption 10x 2# ?Red band horizontal abd 2x10 ?Wall push ups 2x10 ?Finger ladder 2# 10x to the height of pt's tolerance ?Carry 10# 15 ft with Bil UE 4x ?Todays Treatment; 06/08/21: Pt 20 min late, session shortened ? Seated flexion and abduction 1# 2x10  ?Rowing: 10# 2x10 ?Wall  push-ups 2x10 ?Seated bicep curls 2# 10x ?Arm bike L 1.6 6 min (3/3) with PT present to monitor  ? ? ? ?HOME EXERCISE PROGRAM: ?Access Code: [email or web address] ? ?Goals reviewed with patient? Yes ? ?SHORT TERM GOALS: ? ?STG Name Target Date Goal status  ?1 be independent in initial HEP  ?Baseline:   MET  ?2 report < or = to 6/10 Rt arm pain with reaching and lifting with daily tasks  ?Baseline: 4/10 06/22/21 MET  ?3 report > or = to 85% use of the Rt UE with daily tasks  ?Baseline: 80% 07/06/2021 IN PROGRESS  ?     ?     ?     ?     ? ?LONG TERM GOALS:  ? ?LTG Name Target Date Goal status  ?1 be independent in initial HEP  ?Baseline: 07/27/2021 IN PROGRESS  ?2 improve FOTO to > or = to 63  ?Baseline: 07/27/2021 IN PROGRESS  ?3 report > or = to 95% use of Rt UE with ADLs and self-care due to reduced pain  ?Baseline:  75%  07/27/2021 IN PROGRESS  ?4 demonstrate 4+/5 Rt shoulder strength to improve endurance with use  ?Baseline:  Rt shoulder flexion: 4+/5, abduction 4/5, IR 4+/5, ER 4/5 07/27/2021 IN PROGRESS  ?5 report < or = to 3/10 Rt shoulder pain with use  ?Baseline: 6-7/10  07/27/2021 IN PROGRESS  ?     ?     ? ? ? ? ? ? ? ?ASSESSMENT: ? ?CLINICAL IMPRESSION:  Pt is doing well with advancement of exercises and demonstrates fewer compensatory motions with movement of weight against gravity.  Pt requires verbal and demo cueing for alignment and posture.  Pt reports 80% use of Rt UE with functional tasks and reports 4/10 max pain with this.  Pt will continue to benefit from skilled PT to address Rt shoulder strength and endurance.  ? ?PLAN: ?PT FREQUENCY: 2x/week ? ?PT DURATION: 6 weeks  ? ?PLANNED INTERVENTIONS: Therapeutic exercises, Therapeutic activity, Neuro Muscular re-education, Balance training, Gait training, Patient/Family education, Joint mobilization, Dry Needling, Cryotherapy, Moist heat, Taping, and Manual therapy ? ?PLAN FOR NEXT SESSION: Rt shoulder strength, endurance and flexibility to improve functional use. Pt has  4 appts left. ? ? ? ?Sigurd Sos, PT ?06/15/21 4:14 PM  ?   ?

## 2021-06-16 ENCOUNTER — Other Ambulatory Visit: Payer: Medicare Other | Admitting: *Deleted

## 2021-06-17 ENCOUNTER — Encounter: Payer: Self-pay | Admitting: Neurology

## 2021-06-20 ENCOUNTER — Other Ambulatory Visit: Payer: Self-pay

## 2021-06-20 ENCOUNTER — Encounter: Payer: Self-pay | Admitting: Physical Therapy

## 2021-06-20 ENCOUNTER — Ambulatory Visit: Payer: Medicare Other | Admitting: Physical Therapy

## 2021-06-20 DIAGNOSIS — M79601 Pain in right arm: Secondary | ICD-10-CM

## 2021-06-20 DIAGNOSIS — M6281 Muscle weakness (generalized): Secondary | ICD-10-CM

## 2021-06-20 DIAGNOSIS — R252 Cramp and spasm: Secondary | ICD-10-CM | POA: Diagnosis not present

## 2021-06-20 NOTE — Therapy (Signed)
?OUTPATIENT PHYSICAL THERAPY TREATMENT NOTE ? ? ?Patient Name: Brandi Mora ?MRN: [id number] ?DOB:31-Jan-1956, 66 y.o., female ?Today's Date: 06/20/2021 ? ?PCP: Glenis Smoker, MD ?REFERRING PROVIDER: Glenis Smoker, MD ?  ? PT End of Session - 06/20/21 1529   ? ? Visit Number 15   ? Date for PT Re-Evaluation 07/13/21   ? Authorization Type [employer] Medicare   ? Progress Note Due on Visit 11   ? PT Start Time 1528   ? PT Stop Time 1558   ? PT Time Calculation (min) 30 min   ? Activity Tolerance --   ? Behavior During Therapy [facility] for tasks assessed/performed   ? ?  ?  ? ?  ? ? ? ? ? ? ?Past Medical History:  ?Diagnosis Date  ? Anemia   ? Anxiety   ? Depression   ? Fibromyalgia   ? High cholesterol   ? Mildly obese   ? Myalgia and myositis 01/02/2014  ? Restless legs syndrome (RLS) 12/09/2013  ? Tremor 09/09/2013  ? ?Past Surgical History:  ?Procedure Laterality Date  ? COSMETIC SURGERY  1976  ? dermoid cyst removal  1990  ? OOPHORECTOMY    ? vagal nerve stimulator placement Left   ? ?Patient Active Problem List  ? Diagnosis Date Noted  ? Arthritis 06/07/2021  ? Bilateral carpal tunnel syndrome 06/07/2021  ? Chronic fatigue syndrome 06/07/2021  ? Fibromyalgia 06/07/2021  ? Colon cancer screening 06/07/2021  ? Daytime somnolence 06/07/2021  ? Decreased estrogen level 06/07/2021  ? Duodenal ulcer 06/07/2021  ? 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 Lymphocytosis 06/07/2021  ? Moderate recurrent major depression ([place]) 06/07/2021  ? Nausea and vomiting 06/07/2021  ? Vitamin D deficiency 06/07/2021  ? Nausea 06/07/2021  ? Dizziness 06/07/2021  ? Gait abnormality 12/16/2018  ? Complaints of memory disturbance 12/16/2018  ? Myalgia and myositis 01/02/2014  ? Restless legs syndrome (RLS) 12/09/2013  ? Tremor 09/09/2013  ? Scoliosis 09/15/2011  ? MUCOCELE, 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  ? HYPERLIPIDEMIA 03/08/2010  ? HERPES ZOSTER 02/28/2010  ? VAGINAL BLEEDING, ABNORMAL 12/03/2009  ? LOOSE STOOLS 12/03/2009  ? UNSPECIFIED  URINARY INCONTINENCE 12/03/2009  ? WEIGHT GAIN 10/08/2009  ? DRY SKIN 10/15/2008  ? INSOMNIA 10/15/2008  ? WEIGHT LOSS, RECENT 10/15/2008  ? VAGINAL PRURITUS 07/31/2008  ? SKIN TAG 06/26/2008  ? SEBORRHEIC KERATOSIS 06/26/2008  ? PRURITUS 05/28/2008  ? FATIGUE, CHRONIC 05/28/2008  ? DISTURBANCES OF SENSATION OF SMELL AND TASTE 05/28/2008  ? MASS, CHEST WALL 05/28/2008  ? ANEMIA-NOS 05/15/2008  ? ANXIETY 05/15/2008  ? DEPRESSION 05/15/2008  ? ? ?REFERRING DIAG: Rt arm pain with fibromyalgia  ? ?THERAPY DIAG:  ?Pain in right arm ? ?Cramp and spasm ? ?Muscle weakness (generalized) ? ?PERTINENT HISTORY: fibromyalgia, depression, anxiety  ? ?PRECAUTIONS: none ? ?SUBJECTIVE: I am having issues with some of my medications. PTA advised pt to speak with her MD about it and how to proceed. Shoulder doing well.  ?PAIN:  ?Are you having pain? no ?NPRS scale:  ?Pain location:  ?Pain orientation:  ?PAIN TYPE:  ?Pain description: Aggravating factors: holding phone, reaching overhead ?Relieving factors: rest ? ?OBJECTIVE:  ? ?05/18/21 ?Rt shoulder flexion: 4+/5, abduction 4/5, IR 4+/5, ER 4/5 ?06/01/21 ?Rt shoulder flexion: 4+/5, abduction 4/5, IR 4+/5, ER 4/5 ?FOTO: 44 (goal is 64) ? ?TODAY'S TREATMENT:  ? ?06/20/21: ?Arm Bike L2 2/2 , pt required small rest beak at 2 min mark.  ?Seated biceps 10#  holding each end of the bell  with each hand 2x10 ?2# flexion to 90 degrees 12x, scaption 12x 2# ?Red band horizontal abd 15x ?Wall push ups 2x12 ?Finger ladder 2# 10x to the height of pt's tolerance ?Ball rolls on the wall: Rt CW and CCW 2x10 each- tactile  ? ? ?06/15/21 ?Arm Bike L2 2/2 , pt required small rest beak at 2 min mark.  ?Seated biceps 10#  holding each end of the bell with each hand 2x10 ?2# flexion to 90 degrees 10x, scaption 10x 2# ?Red band horizontal abd 2x10 ?Wall push ups 2x10 ?Finger ladder 2# 10x to the height of pt's tolerance ?Ball rolls on the wall: Rt CW and CCW 2x10 each- tactile  ?06/13/21 ?Arm Bike L1.5 3x3 , pt  required small rest beak at 2 min mark.  ?Seated biceps 2# 2x10 ?2# flexion to 90 degrees 10x, scaption 10x 2# ?Red band horizontal abd 2x10 ?Wall push ups 2x10 ?Finger ladder 2# 10x to the height of pt's tolerance ?Carry 10# 15 ft with Bil UE 4x ? ? ? ? ?HOME EXERCISE PROGRAM: ?Access Code: [email or web address] ? ?Goals reviewed with patient? Yes ? ?SHORT TERM GOALS: ? ?STG Name Target Date Goal status  ?1 be independent in initial HEP  ?Baseline:   MET  ?2 report < or = to 6/10 Rt arm pain with reaching and lifting with daily tasks  ?Baseline: 4/10 06/22/21 MET  ?3 report > or = to 85% use of the Rt UE with daily tasks  ?Baseline: 80% 07/11/2021 IN PROGRESS  ?     ?     ?     ?     ? ?LONG TERM GOALS:  ? ?LTG Name Target Date Goal status  ?1 be independent in initial HEP  ?Baseline: 08/01/2021 IN PROGRESS  ?2 improve FOTO to > or = to 63  ?Baseline: 08/01/2021 IN PROGRESS  ?3 report > or = to 95% use of Rt UE with ADLs and self-care due to reduced pain  ?Baseline:  75%  08/01/2021 IN PROGRESS  ?4 demonstrate 4+/5 Rt shoulder strength to improve endurance with use  ?Baseline:  Rt shoulder flexion: 4+/5, abduction 4/5, IR 4+/5, ER 4/5 08/01/2021 IN PROGRESS  ?5 report < or = to 3/10 Rt shoulder pain with use  ?Baseline: 6-7/10  08/01/2021 IN PROGRESS  ?     ?     ? ? ? ? ? ? ? ?ASSESSMENT: ? ?CLINICAL IMPRESSION:  Pt is doing well with advancement of exercises and demonstrates fewer compensatory motions with movement of weight against gravity. Pt handled an increase in reps today with some extra fatigue. Pt reports 80% use of Rt UE with functional tasks and reports 4/10 max pain with this. Pt will continue to benefit from skilled PT to address Rt shoulder strength and endurance.  ? ?PLAN: ?PT FREQUENCY: 2x/week ? ?PT DURATION: 6 weeks  ? ?PLANNED INTERVENTIONS: Therapeutic exercises, Therapeutic activity, Neuro Muscular re-education, Balance training, Gait training, Patient/Family education, Joint mobilization, Dry Needling, Cryotherapy,  Moist heat, Taping, and Manual therapy ? ?PLAN FOR NEXT SESSION: Rt shoulder strength, endurance and flexibility to improve functional use. Pt has 3 appts left. ? ?Myrene Galas, PTA ?06/20/21 3:58 PM  ? ?Sigurd Sos, PT ?06/20/21 3:58 PM  ?   ?

## 2021-06-21 ENCOUNTER — Ambulatory Visit: Payer: Medicare Other | Admitting: Neurology

## 2021-06-22 ENCOUNTER — Other Ambulatory Visit: Payer: Self-pay

## 2021-06-22 ENCOUNTER — Ambulatory Visit: Payer: Medicare Other

## 2021-06-22 DIAGNOSIS — M6281 Muscle weakness (generalized): Secondary | ICD-10-CM | POA: Diagnosis not present

## 2021-06-22 DIAGNOSIS — R252 Cramp and spasm: Secondary | ICD-10-CM | POA: Diagnosis not present

## 2021-06-22 DIAGNOSIS — M79601 Pain in right arm: Secondary | ICD-10-CM | POA: Diagnosis not present

## 2021-06-22 NOTE — Therapy (Signed)
?OUTPATIENT PHYSICAL THERAPY TREATMENT NOTE ? ? ?Patient Name: Brandi Mora ?MRN: [id number] ?DOB:04-12-1955, 66 y.o., female ?Today's Date: 06/22/2021 ? ?PCP: Glenis Smoker, MD ?REFERRING PROVIDER: Glenis Smoker, MD ?  ? PT End of Session - 06/22/21 1541   ? ? Visit Number 16   ? Date for PT Re-Evaluation 07/13/21   ? Authorization Type [employer] Medicare   ? Progress Note Due on Visit 11   ? PT Start Time 1532   ? PT Stop Time 3614   ? PT Time Calculation (min) 27 min   ? Activity Tolerance Patient tolerated treatment well   ? Behavior During Therapy [facility] for tasks assessed/performed   ? ?  ?  ? ?  ? ? ? ? ? ? ? ?Past Medical History:  ?Diagnosis Date  ? Anemia   ? Anxiety   ? Depression   ? Fibromyalgia   ? High cholesterol   ? Mildly obese   ? Myalgia and myositis 01/02/2014  ? Restless legs syndrome (RLS) 12/09/2013  ? Tremor 09/09/2013  ? ?Past Surgical History:  ?Procedure Laterality Date  ? COSMETIC SURGERY  1976  ? dermoid cyst removal  1990  ? OOPHORECTOMY    ? vagal nerve stimulator placement Left   ? ?Patient Active Problem List  ? Diagnosis Date Noted  ? Arthritis 06/07/2021  ? Bilateral carpal tunnel syndrome 06/07/2021  ? Chronic fatigue syndrome 06/07/2021  ? Fibromyalgia 06/07/2021  ? Colon cancer screening 06/07/2021  ? Daytime somnolence 06/07/2021  ? Decreased estrogen level 06/07/2021  ? Duodenal ulcer 06/07/2021  ? 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 Lymphocytosis 06/07/2021  ? Moderate recurrent major depression ([place]) 06/07/2021  ? Nausea and vomiting 06/07/2021  ? Vitamin D deficiency 06/07/2021  ? Nausea 06/07/2021  ? Dizziness 06/07/2021  ? Gait abnormality 12/16/2018  ? Complaints of memory disturbance 12/16/2018  ? Myalgia and myositis 01/02/2014  ? Restless legs syndrome (RLS) 12/09/2013  ? Tremor 09/09/2013  ? Scoliosis 09/15/2011  ? MUCOCELE, 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  ? HYPERLIPIDEMIA 03/08/2010  ? HERPES ZOSTER 02/28/2010  ? VAGINAL BLEEDING, ABNORMAL 12/03/2009  ? LOOSE  STOOLS 12/03/2009  ? UNSPECIFIED URINARY INCONTINENCE 12/03/2009  ? WEIGHT GAIN 10/08/2009  ? DRY SKIN 10/15/2008  ? INSOMNIA 10/15/2008  ? WEIGHT LOSS, RECENT 10/15/2008  ? VAGINAL PRURITUS 07/31/2008  ? SKIN TAG 06/26/2008  ? SEBORRHEIC KERATOSIS 06/26/2008  ? PRURITUS 05/28/2008  ? FATIGUE, CHRONIC 05/28/2008  ? DISTURBANCES OF SENSATION OF SMELL AND TASTE 05/28/2008  ? MASS, CHEST WALL 05/28/2008  ? ANEMIA-NOS 05/15/2008  ? ANXIETY 05/15/2008  ? DEPRESSION 05/15/2008  ? ? ?REFERRING DIAG: Rt arm pain with fibromyalgia  ? ?THERAPY DIAG:  ?Pain in right arm ? ?Cramp and spasm ? ?Muscle weakness (generalized) ? ?PERTINENT HISTORY: fibromyalgia, depression, anxiety  ? ?PRECAUTIONS: none ? ?SUBJECTIVE: Pt arrived reporting stress today.  Shoulder is doing good.   ?PAIN:  ?Are you having pain? no ?NPRS scale:  ?Pain location:  ?Pain orientation:  ?PAIN TYPE:  ?Pain description: Aggravating factors: holding phone, reaching overhead ?Relieving factors: rest ? ?OBJECTIVE:  ? ?05/18/21 ?Rt shoulder flexion: 4+/5, abduction 4/5, IR 4+/5, ER 4/5 ?06/01/21 ?Rt shoulder flexion: 4+/5, abduction 4/5, IR 4+/5, ER 4/5 ?FOTO: 44 (goal is 34) ? ?TODAY'S TREATMENT:  ?06/22/21: ?Arm Bike L2 x 6 min (3/3) -PT monitored during this task. ?Seated biceps 10#  holding each end of the bell with each hand 2x10 ?2# flexion to 90 degrees & scaption 2x10  ?Red band  horizontal abd 2x10                                                                                                                                                                                                                                                                                                                                                                                                                                                                                                                                                                                                                                                                                                                                                                                                                                                                                                                                                                                                                                                                                                  ?  Wall push ups 2x12 ?Finger ladder 2# 10x to the height of pt's tolerance ?Ball rolls on the wall: Rt CW and CCW 2x10 each- tactile  ? ?06/20/21: ?Arm Bike L2 2/2 , pt required small rest beak at 2 min mark.  ?Seated biceps 10#  holding each end of the bell with each hand 2x10 ?2# flexion to 90 degrees 12x, scaption 12x 2# ?Red band horizontal abd 15x ?Wall push ups 2x12 ?Finger ladder 2# 10x to the height of pt's tolerance ?Ball rolls on the wall: Rt CW and CCW 2x10 each- tactile  ? ? ?06/15/21 ?Arm Bike L2 2/2 , pt required small rest beak at 2 min mark.  ?Seated biceps 10#  holding each end of the bell with each hand 2x10 ?2# flexion to 90 degrees 10x, scaption 10x 2# ?Red band horizontal abd 2x10 ?Wall push ups 2x10 ?Finger ladder 2# 10x to the height of pt's tolerance ?Ball rolls on the wall: Rt CW and CCW 2x10 each- tactile  ? ? ?HOME EXERCISE PROGRAM: ?Access Code: [email or web address] ? ?Goals reviewed with patient? Yes ? ?SHORT TERM GOALS: ? ?STG Name Target Date Goal status  ?1 be independent in initial HEP  ?Baseline:   MET  ?2 report < or = to 6/10 Rt arm pain with reaching and lifting with daily tasks  ?Baseline: 4/10 06/22/21 MET  ?3 report > or = to 85% use of the Rt UE with daily tasks  ?Baseline: 80% 07/13/2021 IN PROGRESS  ?     ?     ?     ?     ? ?LONG TERM GOALS:  ? ?LTG Name Target Date Goal status  ?1 be independent in initial HEP  ?Baseline: 08/03/2021 IN PROGRESS  ?2 improve FOTO to > or = to 63  ?Baseline: 08/03/2021 IN PROGRESS  ?3  report > or = to 95% use of Rt UE with ADLs and self-care due to reduced pain  ?Baseline:  75%  08/03/2021 IN PROGRESS  ?4 demonstrate 4+/5 Rt shoulder strength to improve endurance with use  ?Baseline:  Rt shoulder flexion: 4+/5, abduction 4/5, IR 4+/5, ER 4/5 08/03/2021 IN PROGRESS  ?5 report < or = to 3/10 Rt shoulder pain with use  ?Baseline: 6-7/10  08/03/2021 IN PROGRESS  ?     ?     ? ? ? ? ? ? ? ?ASSESSMENT: ? ?CLINICAL IMPRESSION:  Pt is doing well with advancement of exercises and is able to do more time on the arm bike with 1 rest break. Pt required minor verbal and tactile cues for scapular position.  Pt reports 80% use of Rt UE with functional tasks and reports 4/10 max pain with use. Pt will continue to benefit from skilled PT to address Rt shoulder strength and endurance.  ? ?PLAN: ?PT FREQUENCY: 2x/week ? ?PT DURATION: 6 weeks  ? ?PLANNED INTERVENTIONS: Therapeutic exercises, Therapeutic activity, Neuro Muscular re-education, Balance training, Gait training, Patient/Family education, Joint mobilization, Dry Needling, Cryotherapy, Moist heat, Taping, and Manual therapy ? ?PLAN FOR NEXT SESSION: Rt shoulder strength, endurance and flexibility to improve functional use. Pt will continue until the end of her plan of care on 07/13/21.  ? ? ?Sigurd Sos, PT ?06/22/21 4:02 PM  ?   ?

## 2021-06-27 ENCOUNTER — Other Ambulatory Visit: Payer: Self-pay

## 2021-06-27 ENCOUNTER — Ambulatory Visit: Payer: Medicare Other

## 2021-06-27 DIAGNOSIS — M79601 Pain in right arm: Secondary | ICD-10-CM | POA: Diagnosis not present

## 2021-06-27 DIAGNOSIS — R252 Cramp and spasm: Secondary | ICD-10-CM

## 2021-06-27 DIAGNOSIS — M6281 Muscle weakness (generalized): Secondary | ICD-10-CM | POA: Diagnosis not present

## 2021-06-27 NOTE — Therapy (Signed)
?OUTPATIENT PHYSICAL THERAPY TREATMENT NOTE ? ? ?Patient Name: Brandi Mora ?MRN: [id number] ?DOB:[birth year], 66 y.o., female ?Today's Date: 06/27/2021 ? ?PCP: Glenis Smoker, MD ?REFERRING PROVIDER: Glenis Smoker, MD ?  ? PT End of Session - 06/27/21 1605   ? ? Visit Number 17   ? Date for PT Re-Evaluation 07/13/21   ? Authorization Type [employer]HC Medicare   ? Progress Note Due on Visit 20   ? PT Start Time 7353   ? PT Stop Time 2992   pt requested shorter session due to not feeling good emotionally  ? PT Time Calculation (min) 26 min   ? Activity Tolerance Other (comment)   ? Behavior During Therapy [facility] for tasks assessed/performed   ? ?  ?  ? ?  ? ? ? ? ? ? ? ? ?Past Medical History:  ?Diagnosis Date  ? Anemia   ? Anxiety   ? Depression   ? Fibromyalgia   ? High cholesterol   ? Mildly obese   ? Myalgia and myositis 01/02/2014  ? Restless legs syndrome (RLS) 12/09/2013  ? Tremor 09/09/2013  ? ?Past Surgical History:  ?Procedure Laterality Date  ? COSMETIC SURGERY  1976  ? dermoid cyst removal  1990  ? OOPHORECTOMY    ? vagal nerve stimulator placement Left   ? ?Patient Active Problem List  ? Diagnosis Date Noted  ? Arthritis 06/07/2021  ? Bilateral carpal tunnel syndrome 06/07/2021  ? Chronic fatigue syndrome 06/07/2021  ? Fibromyalgia 06/07/2021  ? Colon cancer screening 06/07/2021  ? Daytime somnolence 06/07/2021  ? Decreased estrogen level 06/07/2021  ? Duodenal ulcer 06/07/2021  ? 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 Lymphocytosis 06/07/2021  ? Moderate recurrent major depression ([place]) 06/07/2021  ? Nausea and vomiting 06/07/2021  ? Vitamin D deficiency 06/07/2021  ? Nausea 06/07/2021  ? Dizziness 06/07/2021  ? Gait abnormality 12/16/2018  ? Complaints of memory disturbance 12/16/2018  ? Myalgia and myositis 01/02/2014  ? Restless legs syndrome (RLS) 12/09/2013  ? Tremor 09/09/2013  ? Scoliosis 09/15/2011  ? MUCOCELE, 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  ? HYPERLIPIDEMIA 03/08/2010  ? HERPES ZOSTER 02/28/2010  ?  VAGINAL BLEEDING, ABNORMAL 12/03/2009  ? LOOSE STOOLS 12/03/2009  ? UNSPECIFIED URINARY INCONTINENCE 12/03/2009  ? WEIGHT GAIN 10/08/2009  ? DRY SKIN 10/15/2008  ? INSOMNIA 10/15/2008  ? WEIGHT LOSS, RECENT 10/15/2008  ? VAGINAL PRURITUS 07/31/2008  ? SKIN TAG 06/26/2008  ? SEBORRHEIC KERATOSIS 06/26/2008  ? PRURITUS 05/28/2008  ? FATIGUE, CHRONIC 05/28/2008  ? DISTURBANCES OF SENSATION OF SMELL AND TASTE 05/28/2008  ? MASS, CHEST WALL 05/28/2008  ? ANEMIA-NOS 05/15/2008  ? ANXIETY 05/15/2008  ? DEPRESSION 05/15/2008  ? ? ?REFERRING DIAG: Rt arm pain with fibromyalgia  ? ?THERAPY DIAG:  ?Pain in right arm ? ?Cramp and spasm ? ?Muscle weakness (generalized) ? ?PERTINENT HISTORY: fibromyalgia, depression, anxiety  ? ?PRECAUTIONS: none ? ?SUBJECTIVE: I am not feeling great today so I would like to skip the arm bike.  I am very stressed.   ?PAIN:  ?Are you having pain? Yes ?NPRS scale: 3/10 ?Pain location: Rt UE ?Pain orientation: right  ?PAIN TYPE: sore, tired ?Pain description: Aggravating factors: holding phone, reaching overhead ?Relieving factors: rest ? ?OBJECTIVE:  ? ?05/18/21 ?Rt shoulder flexion: 4+/5, abduction 4/5, IR 4+/5, ER 4/5 ?06/01/21 ?Rt shoulder flexion: 4+/5, abduction 4/5, IR 4+/5, ER 4/5 ?FOTO: 44 (goal is 16) ? ?TODAY'S TREATMENT:  ?06/27/21: ?Seated biceps 10#  holding each end of the bell with each hand 2x10 ?  2# flexion to 90 degrees & scaption 2x10  ?Red band horizontal abd 2x10           ?Row: 5# 2x10                                                                                                                                                                                                                                                                                                                                                                                                                                                                                                                                                                                                                                                                                                                                                                                                                                                                                                                                                                                                                                                                                        ?  Wall push ups 2x12 ?Finger ladder 2# 10x to the height of pt's tolerance flexion and abduction each ?Ball rolls on the wall: Rt CW and CCW 2x10 each- tactile  ? ?06/22/21: ?Arm Bike L2 x 6 min (3/3) -PT monitored during this task. ?Seated biceps 10#  holding each end of the bell with each hand 2x10 ?2# flexion to 90 degrees & scaption 2x10  ?Red band horizontal abd 2x10                                                                                                                                                                                                                                                                                                                                                                                                                                                                                                                                                                                                                                                                                                                                                                                                                                                                                                                                                                                                                                                                                                  ?  Wall push ups 2x12 ?Finger ladder 2# 10x to the height of pt's tolerance ?Ball rolls on the wall: Rt CW and CCW 2x10 each- tactile  ? ?06/20/21: ?Arm Bike L2 2/2 , pt required small rest beak at 2 min mark.  ?Seated biceps 10#  holding each end of the bell with each hand 2x10 ?2# flexion to 90 degrees 12x, scaption 12x 2# ?Red band horizontal abd 15x ?Wall push ups 2x12 ?Finger ladder 2# flexion and abduction x 10 each ?Ball rolls on the wall: Rt CW and CCW 2x10 each- tactile  ? ?HOME EXERCISE PROGRAM: ?Access Code: [email or web address] ? ?Goals reviewed with patient? Yes ? ?SHORT TERM GOALS: ? ?STG Name Target Date Goal status  ?1 be independent in initial HEP  ?Baseline:   MET  ?2 report < or = to 6/10 Rt arm pain with reaching and lifting with daily tasks  ?Baseline: 4/10 06/22/21 MET  ?3 report > or = to 85% use of the Rt UE with daily tasks  ?Baseline: 80% 07/18/2021 IN PROGRESS  ?     ?     ?     ?     ? ?LONG TERM GOALS:  ? ?LTG Name Target Date Goal status  ?1 be independent in initial HEP  ?Baseline: 08/08/2021 IN PROGRESS  ?2 improve FOTO to > or = to 63  ?Baseline: 08/08/2021 IN PROGRESS  ?3 report > or = to 95% use of Rt UE with ADLs and self-care due to reduced pain  ?Baseline:  75%  08/08/2021 IN PROGRESS  ?4 demonstrate 4+/5 Rt shoulder strength to improve endurance with use  ?Baseline:  Rt shoulder flexion: 4+/5, abduction 4/5, IR 4+/5, ER 4/5 08/08/2021 IN PROGRESS  ?5 report < or = to 3/10 Rt shoulder pain with use  ?Baseline: 6-7/10  08/08/2021 IN PROGRESS  ?     ?     ? ? ? ? ? ? ? ?ASSESSMENT: ? ?CLINICAL IMPRESSION:  Pt arrived with report of increased stress and requested to not do the arm bike.  Pt required increased verbal and tactile cues for scapular position due to high stress.  Pt reports 80% use of Rt UE with functional tasks and reports 3/10 Rt UE pain today.  Pt with increased ease with all movements overhead today and had improved control and technique with wall circles. Pt will continue  to benefit from skilled PT to address Rt shoulder strength and endurance.  ? ?PLAN: ?PT FREQUENCY: 2x/week ? ?PT DURATION: 6 weeks  ? ?PLANNED INTERVENTIONS: Therapeutic exercises, Therapeutic activ

## 2021-06-28 ENCOUNTER — Telehealth: Payer: Self-pay | Admitting: Neurology

## 2021-06-28 NOTE — Telephone Encounter (Signed)
Please let patient know, because of her VNS placement, put major limitation on MRI, ? ?If she still want to proceed with brain imaging study, I can change her CT head. ?

## 2021-06-28 NOTE — Telephone Encounter (Signed)
patient has a device order sent to be scheduled at [facility]  ?

## 2021-06-28 NOTE — Telephone Encounter (Signed)
Pt would like a call back to discuss rescheduling brain imaging appt at the hospital. ?

## 2021-06-29 ENCOUNTER — Ambulatory Visit: Payer: Medicare Other

## 2021-06-29 DIAGNOSIS — R252 Cramp and spasm: Secondary | ICD-10-CM | POA: Diagnosis not present

## 2021-06-29 DIAGNOSIS — M6281 Muscle weakness (generalized): Secondary | ICD-10-CM | POA: Diagnosis not present

## 2021-06-29 DIAGNOSIS — M79601 Pain in right arm: Secondary | ICD-10-CM | POA: Diagnosis not present

## 2021-06-29 NOTE — Therapy (Signed)
?OUTPATIENT PHYSICAL THERAPY TREATMENT NOTE ? ? ?Patient Name: Brandi Mora ?MRN: [id number] ?DOB:1955-12-22, 66 y.o., female ?Today's Date: 06/29/2021 ? ?PCP: Glenis Smoker, MD ?REFERRING PROVIDER: Glenis Smoker, MD ?  ? PT End of Session - 06/29/21 1605   ? ? Visit Number 18   ? Date for PT Re-Evaluation 07/13/21   ? Authorization Type [employer] Medicare   ? Progress Note Due on Visit 20   ? PT Start Time 6144   ? PT Stop Time 3154   ? PT Time Calculation (min) 28 min   ? Activity Tolerance Patient limited by fatigue   ? Behavior During Therapy [facility] for tasks assessed/performed   ? ?  ?  ? ?  ? ? ? ? ? ? ? ? ? ?Past Medical History:  ?Diagnosis Date  ? Anemia   ? Anxiety   ? Depression   ? Fibromyalgia   ? High cholesterol   ? Mildly obese   ? Myalgia and myositis 01/02/2014  ? Restless legs syndrome (RLS) 12/09/2013  ? Tremor 09/09/2013  ? ?Past Surgical History:  ?Procedure Laterality Date  ? COSMETIC SURGERY  1976  ? dermoid cyst removal  1990  ? OOPHORECTOMY    ? vagal nerve stimulator placement Left   ? ?Patient Active Problem List  ? Diagnosis Date Noted  ? Arthritis 06/07/2021  ? Bilateral carpal tunnel syndrome 06/07/2021  ? Chronic fatigue syndrome 06/07/2021  ? Fibromyalgia 06/07/2021  ? Colon cancer screening 06/07/2021  ? Daytime somnolence 06/07/2021  ? Decreased estrogen level 06/07/2021  ? Duodenal ulcer 06/07/2021  ? 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 Lymphocytosis 06/07/2021  ? Moderate recurrent major depression ([place]) 06/07/2021  ? Nausea and vomiting 06/07/2021  ? Vitamin D deficiency 06/07/2021  ? Nausea 06/07/2021  ? Dizziness 06/07/2021  ? Gait abnormality 12/16/2018  ? Complaints of memory disturbance 12/16/2018  ? Myalgia and myositis 01/02/2014  ? Restless legs syndrome (RLS) 12/09/2013  ? Tremor 09/09/2013  ? Scoliosis 09/15/2011  ? MUCOCELE, 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  ? HYPERLIPIDEMIA 03/08/2010  ? HERPES ZOSTER 02/28/2010  ? VAGINAL BLEEDING, ABNORMAL 12/03/2009  ? LOOSE STOOLS  12/03/2009  ? UNSPECIFIED URINARY INCONTINENCE 12/03/2009  ? WEIGHT GAIN 10/08/2009  ? DRY SKIN 10/15/2008  ? INSOMNIA 10/15/2008  ? WEIGHT LOSS, RECENT 10/15/2008  ? VAGINAL PRURITUS 07/31/2008  ? SKIN TAG 06/26/2008  ? SEBORRHEIC KERATOSIS 06/26/2008  ? PRURITUS 05/28/2008  ? FATIGUE, CHRONIC 05/28/2008  ? DISTURBANCES OF SENSATION OF SMELL AND TASTE 05/28/2008  ? MASS, CHEST WALL 05/28/2008  ? ANEMIA-NOS 05/15/2008  ? ANXIETY 05/15/2008  ? DEPRESSION 05/15/2008  ? ? ?REFERRING DIAG: Rt arm pain with fibromyalgia  ? ?THERAPY DIAG:  ?Pain in right arm ? ?Cramp and spasm ? ?Muscle weakness (generalized) ? ?PERTINENT HISTORY: fibromyalgia, depression, anxiety  ? ?PRECAUTIONS: none ? ?SUBJECTIVE: I am not feeling great today so I would like to skip the arm bike.  I am very stressed.   ?PAIN:  ?Are you having pain? Yes ?NPRS scale: 3/10 ?Pain location: Rt UE ?Pain orientation: right  ?PAIN TYPE: sore, tired ?Pain description: Aggravating factors: holding phone, reaching overhead ?Relieving factors: rest ? ?OBJECTIVE:  ? ?05/18/21 ?Rt shoulder flexion: 4+/5, abduction 4/5, IR 4+/5, ER 4/5 ?06/01/21 ?Rt shoulder flexion: 4+/5, abduction 4/5, IR 4+/5, ER 4/5 ?FOTO: 44 (goal is 56) ? ?TODAY'S TREATMENT:  ?06/29/21: ?Arm Bike L2 x 6 min (3/3) -PT monitored during this task. ?Seated biceps 10#  holding each end of the bell  with each hand 2x10 ?2# flexion to 90 degrees & scaption 2x10  ?Red band horizontal abd 2x10                                                                                                                                                                                                                                                                                                                                                                                                                                                                                                                                                                                                                                                                                                                                                                                                                                                                                                                                                                                                                                                                                                  ?  Wall push ups 2x12 ?Finger ladder 2# 10x to the height of pt's tolerance ?Ball rolls on the wall: Rt CW and CCW 2x10 each- intermittent tactile and verbal cues  ?06/27/21: ?Seated biceps 10#  holding each end of the bell with each hand 2x10 ?2# flexion to 90 degrees & scaption 2x10  ?Red band horizontal abd 2x10           ?Row: 5# 2x10                                                                                                                                                                                                                                                                                                                                                                                                                                                                                                                                                                                                                                                                                                                                                                                                                                                                                                                                                                                                                                                                                       ?  Wall push ups 2x12 ?Finger ladder  2# 10x to the height of pt's tolerance flexion and abduction each ?Ball rolls on the wall: Rt CW and CCW 2x10 each- tactile  ? ?06/22/21: ?Arm Bike L2 x 6 min (3/3) -PT monitored during this task. ?Seated biceps 10#  holding each end of the bell with each hand 2x10 ?2# flexion to 90 degrees & scaption 2x10  ?Red band horizontal abd 2x10                                                                                                                                                                                                                                                                                                                                                                                                                                                                                                                                                                                                                                                                                                                                                                                                                                                                                                                                                                                                                                                                                                 ?  Wall push ups 2x12 ?Finger ladder 2# 10x to the height of pt's tolerance ?Ball rolls on the wall: Rt CW and CCW 2x10 each- tactile  ? ? ?HOME EXERCISE PROGRAM: ?Access Code: [email or web address] ? ?Goals reviewed with patient? Yes ? ?SHORT TERM GOALS: ? ?STG Name Target Date Goal status  ?1 be independent in initial HEP  ?Baseline:   MET  ?2 report < or = to 6/10 Rt arm pain with reaching and lifting with daily tasks  ?Baseline: 4/10 06/22/21 MET  ?3 report > or = to 85% use of the Rt UE with daily tasks  ?Baseline: 80% 07/20/2021 IN PROGRESS  ?     ?     ?      ?     ? ?LONG TERM GOALS:  ? ?LTG Name Target Date Goal status  ?1 be independent in initial HEP  ?Baseline: 08/10/2021 IN PROGRESS  ?2 improve FOTO to > or = to 63  ?Baseline: 08/10/2021 IN PROGRESS

## 2021-06-29 NOTE — Telephone Encounter (Signed)
I called the pt and we discussed message. She is ok with proceeding with CT of the head.  ?

## 2021-06-29 NOTE — Addendum Note (Signed)
Addended by: Levert Feinstein on: 06/29/2021 03:27 PM ? ? Modules accepted: Orders ? ?

## 2021-06-29 NOTE — Telephone Encounter (Signed)
I have canceled MRI of the brain, ordered CT of head, please give her a follow-up appointment at my next available to follow-up CT imaging, and check on her VNS ?

## 2021-06-30 ENCOUNTER — Telehealth: Payer: Self-pay | Admitting: Neurology

## 2021-06-30 NOTE — Telephone Encounter (Signed)
[employer] medicare/medicaid order sent to [facility], [facility] they will reach out to the patient to schedule.  ?

## 2021-07-04 ENCOUNTER — Ambulatory Visit: Payer: Medicare Other

## 2021-07-13 ENCOUNTER — Ambulatory Visit: Payer: Medicare Other | Attending: Family Medicine

## 2021-07-13 DIAGNOSIS — M79601 Pain in right arm: Secondary | ICD-10-CM | POA: Diagnosis not present

## 2021-07-13 DIAGNOSIS — R252 Cramp and spasm: Secondary | ICD-10-CM | POA: Insufficient documentation

## 2021-07-13 DIAGNOSIS — M6281 Muscle weakness (generalized): Secondary | ICD-10-CM | POA: Diagnosis not present

## 2021-07-13 NOTE — Therapy (Signed)
?OUTPATIENT PHYSICAL THERAPY TREATMENT NOTE ? ? ?Patient Name: Brandi Mora ?MRN: [id number] ?DOB:02/18/1956, 66 y.o., female ?Today's Date: 07/13/2021 ? ?PCP: Glenis Smoker, MD ?REFERRING PROVIDER: Glenis Smoker, MD ? ?Progress Note ?Reporting Period 06/06/21 to 07/13/21 ? ?See note below for Objective Data and Assessment of Progress/Goals.  ? ?  ?  ? PT End of Session - 07/13/21 1412   ? ? Visit Number 19   ? Date for PT Re-Evaluation 08/02/21   ? Authorization Type [employer] Medicare   ? Progress Note Due on Visit 29   ? PT Start Time 1400   ? PT Stop Time 1426   ? PT Time Calculation (min) 26 min   ? Activity Tolerance Patient limited by fatigue   ? Behavior During Therapy [facility] for tasks assessed/performed   ? ?  ?  ? ?  ? ? ? ? ? ? ? ? ? ? ?Past Medical History:  ?Diagnosis Date  ? Anemia   ? Anxiety   ? Depression   ? Fibromyalgia   ? High cholesterol   ? Mildly obese   ? Myalgia and myositis 01/02/2014  ? Restless legs syndrome (RLS) 12/09/2013  ? Tremor 09/09/2013  ? ?Past Surgical History:  ?Procedure Laterality Date  ? COSMETIC SURGERY  1976  ? dermoid cyst removal  1990  ? OOPHORECTOMY    ? vagal nerve stimulator placement Left   ? ?Patient Active Problem List  ? Diagnosis Date Noted  ? Arthritis 06/07/2021  ? Bilateral carpal tunnel syndrome 06/07/2021  ? Chronic fatigue syndrome 06/07/2021  ? Fibromyalgia 06/07/2021  ? Colon cancer screening 06/07/2021  ? Daytime somnolence 06/07/2021  ? Decreased estrogen level 06/07/2021  ? Duodenal ulcer 06/07/2021  ? 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 Lymphocytosis 06/07/2021  ? Moderate recurrent major depression ([place]) 06/07/2021  ? Nausea and vomiting 06/07/2021  ? Vitamin D deficiency 06/07/2021  ? Nausea 06/07/2021  ? Dizziness 06/07/2021  ? Gait abnormality 12/16/2018  ? Complaints of memory disturbance 12/16/2018  ? Myalgia and myositis 01/02/2014  ? Restless legs syndrome (RLS) 12/09/2013  ? Tremor 09/09/2013  ? Scoliosis 09/15/2011  ? MUCOCELE,  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  ? HYPERLIPIDEMIA 03/08/2010  ? HERPES ZOSTER 02/28/2010  ? VAGINAL BLEEDING, ABNORMAL 12/03/2009  ? LOOSE STOOLS 12/03/2009  ? UNSPECIFIED URINARY INCONTINENCE 12/03/2009  ? WEIGHT GAIN 10/08/2009  ? DRY SKIN 10/15/2008  ? INSOMNIA 10/15/2008  ? WEIGHT LOSS, RECENT 10/15/2008  ? VAGINAL PRURITUS 07/31/2008  ? SKIN TAG 06/26/2008  ? SEBORRHEIC KERATOSIS 06/26/2008  ? PRURITUS 05/28/2008  ? FATIGUE, CHRONIC 05/28/2008  ? DISTURBANCES OF SENSATION OF SMELL AND TASTE 05/28/2008  ? MASS, CHEST WALL 05/28/2008  ? ANEMIA-NOS 05/15/2008  ? ANXIETY 05/15/2008  ? DEPRESSION 05/15/2008  ? ? ?REFERRING DIAG: Rt arm pain with fibromyalgia  ? ?THERAPY DIAG:  ?Pain in right arm ? ?Cramp and spasm ? ?Muscle weakness (generalized) ? ?PERTINENT HISTORY: fibromyalgia, depression, anxiety  ? ?PRECAUTIONS: none ? ?SUBJECTIVE: 80% overall improvement.   ?PAIN:  ?Are you having pain? Yes ?NPRS scale: 2-3/10 ?Pain location: Rt UE ?Pain orientation: right  ?PAIN TYPE: sore, tired ?Pain description: Aggravating factors: holding phone, reaching overhead ?Relieving factors: rest ? ?OBJECTIVE:  ? ?05/18/21 ?Rt shoulder flexion: 4+/5, abduction 4/5, IR 4+/5, ER 4/5 ?06/01/21 ?Rt shoulder flexion: 4+/5, abduction 4/5, IR 4+/5, ER 4/5 ?FOTO: 44 (goal is 50) ?  ? FOTO 60  ?Rt shoulder flexion 4+/5, abduction 4+/5, IR 5/5, ER 4+/5 ?  ? ?TODAY'S  TREATMENT:  ?07/13/21: pt requested limited exercise due to not feeling well today ?Seated biceps 2# 2x10 ?2# flexion to 90 degrees & scaption 2x10  ?Red band: horizontal abduction and ER 2x10                                                                                                                                                                                                                                                                                                                                                                                                                                                                                                                                                                                                                                                                                                                                                                                                                                                                                                                                                                                                                                                                                              ? ? ?  06/29/21: ?Seated biceps 10#  holding each end of the bell with each hand 2x10 ?Arm bike: Le  ?2# flexion to 90 degrees & scaption 2x10  ?Red band horizontal abd 2x10                                                                                                                                                                                                                                                                                                                                                                                                                                                                                                                                                                                                                                                                                                                                                                                                                                                                                                                                                                                                                                                                                                 ? ?  Finger ladder 2# 10x to the height of pt's tolerance ?Ball rolls on  the wall: Rt CW and CCW 2x10 each- intermittent tactile and verbal cues  ?06/27/21: ?Seated biceps 10#  holding each end of the bell with each hand 2x10 ?2# flexion to 90 degrees & scaption 2x10  ?Red band horizontal abd 2x10           ?Row: 5# 2x10                                                                                                                                                                                                                                                                                                                                                                                                                                                                                                                                                                                                                                                                                                                                                                                                                                                                                                                                                                                                                                                                                       ?  Wall push ups 2x12 ?Finger ladder 2# 10x to the height of pt's tolerance flexion and abduction each ?Ball rolls on the wall: Rt CW and CCW 2x10 each- tactile  ? ? ?HOME EXERCISE PROGRAM: ?Access Code: [email or web address] ? ?Goals reviewed with patient? Yes ? ?SHORT TERM GOALS: ? ?STG Name Target Date Goal status  ?1 be independent in initial HEP  ?Baseline:   MET  ?2 report < or = to 6/10 Rt arm pain with reaching and lifting with daily tasks  ?Baseline: 4/10 06/22/21 MET  ?3 report > or = to 85% use of the Rt UE with daily tasks  ?Baseline: 75%  08/05/21 IN PROGRESS  ?     ?     ?     ?     ? ?LONG TERM GOALS:  ? ?LTG Name Target Date Goal status  ?1 be  independent in initial HEP  ?Baseline: further advancement is needed 08/05/21 IN PROGRESS  ?2 improve FOTO to > or = to 63  ?Baseline: 60 (07/13/21) 08/05/21 IN PROGRESS  ?3 report > or = to 95% use of Rt UE with AD

## 2021-07-25 ENCOUNTER — Ambulatory Visit: Payer: Medicare Other | Admitting: Physical Therapy

## 2021-07-25 ENCOUNTER — Encounter: Payer: Self-pay | Admitting: Physical Therapy

## 2021-07-25 DIAGNOSIS — R252 Cramp and spasm: Secondary | ICD-10-CM

## 2021-07-25 DIAGNOSIS — M6281 Muscle weakness (generalized): Secondary | ICD-10-CM | POA: Diagnosis not present

## 2021-07-25 DIAGNOSIS — M79601 Pain in right arm: Secondary | ICD-10-CM

## 2021-07-25 NOTE — Therapy (Signed)
?OUTPATIENT PHYSICAL THERAPY TREATMENT NOTE ? ? ?Patient Name: Brandi Mora ?MRN: [id number] ?DOB:1955-10-19, 66 y.o., female ?Today's Date: 07/25/2021 ? ?PCP: Glenis Smoker, MD ?REFERRING PROVIDER: Glenis Smoker, MD ? ?Progress Note ?Reporting Period 06/06/21 to 07/13/21 ? ?See note below for Objective Data and Assessment of Progress/Goals.  ? ?  ?  ? PT End of Session - 07/25/21 1420   ? ? Visit Number 20   ? Date for PT Re-Evaluation 08/02/21   ? Authorization Type [employer] Medicare   ? Progress Note Due on Visit 29   ? PT Start Time 1420   20 min  ? PT Stop Time 4503   ? PT Time Calculation (min) 25 min   ? Activity Tolerance Other (comment)   Nauseous  ? Behavior During Therapy [facility] for tasks assessed/performed   ? ?  ?  ? ?  ? ? ? ? ? ? ? ? ? ? ?Past Medical History:  ?Diagnosis Date  ? Anemia   ? Anxiety   ? Depression   ? Fibromyalgia   ? High cholesterol   ? Mildly obese   ? Myalgia and myositis 01/02/2014  ? Restless legs syndrome (RLS) 12/09/2013  ? Tremor 09/09/2013  ? ?Past Surgical History:  ?Procedure Laterality Date  ? COSMETIC SURGERY  1976  ? dermoid cyst removal  1990  ? OOPHORECTOMY    ? vagal nerve stimulator placement Left   ? ?Patient Active Problem List  ? Diagnosis Date Noted  ? Arthritis 06/07/2021  ? Bilateral carpal tunnel syndrome 06/07/2021  ? Chronic fatigue syndrome 06/07/2021  ? Fibromyalgia 06/07/2021  ? Colon cancer screening 06/07/2021  ? Daytime somnolence 06/07/2021  ? Decreased estrogen level 06/07/2021  ? Duodenal ulcer 06/07/2021  ? 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 Lymphocytosis 06/07/2021  ? Moderate recurrent major depression ([place]) 06/07/2021  ? Nausea and vomiting 06/07/2021  ? Vitamin D deficiency 06/07/2021  ? Nausea 06/07/2021  ? Dizziness 06/07/2021  ? Gait abnormality 12/16/2018  ? Complaints of memory disturbance 12/16/2018  ? Myalgia and myositis 01/02/2014  ? Restless legs syndrome (RLS) 12/09/2013  ? Tremor 09/09/2013  ? Scoliosis 09/15/2011  ? MUCOCELE,  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  ? HYPERLIPIDEMIA 03/08/2010  ? HERPES ZOSTER 02/28/2010  ? VAGINAL BLEEDING, ABNORMAL 12/03/2009  ? LOOSE STOOLS 12/03/2009  ? UNSPECIFIED URINARY INCONTINENCE 12/03/2009  ? WEIGHT GAIN 10/08/2009  ? DRY SKIN 10/15/2008  ? INSOMNIA 10/15/2008  ? WEIGHT LOSS, RECENT 10/15/2008  ? VAGINAL PRURITUS 07/31/2008  ? SKIN TAG 06/26/2008  ? SEBORRHEIC KERATOSIS 06/26/2008  ? PRURITUS 05/28/2008  ? FATIGUE, CHRONIC 05/28/2008  ? DISTURBANCES OF SENSATION OF SMELL AND TASTE 05/28/2008  ? MASS, CHEST WALL 05/28/2008  ? ANEMIA-NOS 05/15/2008  ? ANXIETY 05/15/2008  ? DEPRESSION 05/15/2008  ? ? ?REFERRING DIAG: Rt arm pain with fibromyalgia  ? ?THERAPY DIAG:  ?Pain in right arm ? ?Cramp and spasm ? ?Muscle weakness (generalized) ? ?PERTINENT HISTORY: fibromyalgia, depression, anxiety  ? ?PRECAUTIONS: none ? ?SUBJECTIVE: 80% overall improvement.  Sorry I was late, wasn't sure I felt good enough. ?PAIN:  ?Are you having pain? Yes ?NPRS scale: 2-3/10 ?Pain location: Rt UE ?Pain orientation: right  ?PAIN TYPE: sore, tired ?Pain description: Aggravating factors: holding phone, reaching overhead ?Relieving factors: rest ? ?OBJECTIVE:  ? ?05/18/21 ?Rt shoulder flexion: 4+/5, abduction 4/5, IR 4+/5, ER 4/5 ?06/01/21 ?Rt shoulder flexion: 4+/5, abduction 4/5, IR 4+/5, ER 4/5 ?FOTO: 44 (goal is 54) ?  ? FOTO 60  ?Rt shoulder  flexion 4+/5, abduction 4+/5, IR 5/5, ER 4+/5 ?  ? ?TODAY'S TREATMENT:  ?07/25/21: ?Bicep curl 3# 2x10 ?3 way raise 3# 5x each ?Red band horizontal abd 2x12, Ext rot 2x12 ?Arm bike: L2 2x2  ?07/13/21: pt requested limited exercise due to not feeling well today ?Seated biceps 2# 2x10 ?2# flexion to 90 degrees & scaption 2x10  ?Red band: horizontal abduction and ER 2x10                                                                                                                                                                                                                                                                                                                                                                                                                                                                                                                                                                                                                                                                                                                                                                                                                                                                                                                                                                                                                                                                                              ? ? ?  06/29/21: ?Seated biceps 10#  holding each end of the bell with each hand 2x10 ?Arm bike: Le  ?2# flexion to 90 degrees & scaption 2x10  ?Red band horizontal abd 2x10                                                                                                                                                                                                                                                                                                                                                                                                                                                                                                                                                                                                                                                                                                                                                                                                                                                                                                                                                                                                                                                                                                  ? ?  Finger ladder 2# 10x to the height of pt's tolerance ?Ball rolls on the wall: Rt CW and CCW 2x10 each- intermittent tactile and verbal cues  ?06/27/21: ?Seated biceps 10#  holding each end of the bell with each hand 2x10 ?2# flexion to 90 degrees & scaption 2x10  ?Red band horizontal abd 2x10           ?Row: 5# 2x10                                                                                                                                                                                                                                                                                                                                                                                                                                                                                                                                                                                                                                                                                                                                                                                                                                                                                                                                                                                                                                                                                       ?  Wall push ups 2x12 ?Finger ladder 2# 10x to the height of pt's tolerance flexion and abduction each ?Ball rolls on the wall: Rt CW and CCW 2x10 each- tactile  ? ? ?HOME EXERCISE PROGRAM: ?Access Code: [email or web address] ? ?Goals reviewed with patient? Yes ? ?SHORT TERM GOALS: ? ?STG Name Target Date Goal status  ?1 be independent in initial HEP  ?Baseline:   MET  ?2 report < or = to 6/10 Rt arm pain with reaching and lifting with daily tasks  ?Baseline: 4/10 06/22/21 MET  ?3 report > or = to  85% use of the Rt UE with daily tasks  ?Baseline: 75%  08/05/21 IN PROGRESS  ?     ?     ?     ?     ? ?LONG TERM GOALS:  ? ?LTG Name Target Date Goal status  ?1 be independent in initial HEP  ?Baseline

## 2021-07-27 ENCOUNTER — Ambulatory Visit
Admission: RE | Admit: 2021-07-27 | Discharge: 2021-07-27 | Disposition: A | Discharge status: Home / Self Care | Payer: Medicare Other | Source: Ambulatory Visit | Attending: Neurology | Admitting: Neurology

## 2021-07-27 DIAGNOSIS — R519 Headache, unspecified: Secondary | ICD-10-CM | POA: Diagnosis not present

## 2021-07-27 DIAGNOSIS — R42 Dizziness and giddiness: Secondary | ICD-10-CM

## 2021-07-27 DIAGNOSIS — R110 Nausea: Secondary | ICD-10-CM

## 2021-07-29 ENCOUNTER — Telehealth: Payer: Self-pay | Admitting: Neurology

## 2021-07-29 NOTE — Telephone Encounter (Signed)
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 ? ?CT head (without) demonstrating: ?- Enlargement of subarachnoid spaces over the frontal convexities, may be related increased CSF hygromas versus underlying frontal atrophy.  There is stable compared to 01/31/2019. ?

## 2021-08-01 ENCOUNTER — Ambulatory Visit: Payer: Medicare Other | Attending: Family Medicine

## 2021-08-01 DIAGNOSIS — M79601 Pain in right arm: Secondary | ICD-10-CM | POA: Diagnosis not present

## 2021-08-01 DIAGNOSIS — R252 Cramp and spasm: Secondary | ICD-10-CM | POA: Insufficient documentation

## 2021-08-01 DIAGNOSIS — M6281 Muscle weakness (generalized): Secondary | ICD-10-CM | POA: Insufficient documentation

## 2021-08-01 NOTE — Therapy (Signed)
?OUTPATIENT PHYSICAL THERAPY TREATMENT NOTE ? ? ?Patient Name: Brandi Mora ?MRN: [id number] ?DOB:[birth year], 66 y.o., female ?Today's Date: 08/01/2021 ? ?PCP: Glenis Smoker, MD ?REFERRING PROVIDER: Glenis Smoker, MD ? ? ? ?  ?  ? PT End of Session - 08/01/21 1700   ? ? Visit Number 21   ? Authorization Type [employer] Medicare   ? PT Start Time 1626   ? PT Stop Time 8144   ? PT Time Calculation (min) 33 min   ? Activity Tolerance Other (comment)   ? Behavior During Therapy [facility] for tasks assessed/performed   ? ?  ?  ? ?  ? ? ? ? ? ? ? ? ? ? ? ?Past Medical History:  ?Diagnosis Date  ? Anemia   ? Anxiety   ? Depression   ? Fibromyalgia   ? High cholesterol   ? Mildly obese   ? Myalgia and myositis 01/02/2014  ? Restless legs syndrome (RLS) 12/09/2013  ? Tremor 09/09/2013  ? ?Past Surgical History:  ?Procedure Laterality Date  ? COSMETIC SURGERY  1976  ? dermoid cyst removal  1990  ? OOPHORECTOMY    ? vagal nerve stimulator placement Left   ? ?Patient Active Problem List  ? Diagnosis Date Noted  ? Arthritis 06/07/2021  ? Bilateral carpal tunnel syndrome 06/07/2021  ? Chronic fatigue syndrome 06/07/2021  ? Fibromyalgia 06/07/2021  ? Colon cancer screening 06/07/2021  ? Daytime somnolence 06/07/2021  ? Decreased estrogen level 06/07/2021  ? Duodenal ulcer 06/07/2021  ? 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 Lymphocytosis 06/07/2021  ? Moderate recurrent major depression ([place]) 06/07/2021  ? Nausea and vomiting 06/07/2021  ? Vitamin D deficiency 06/07/2021  ? Nausea 06/07/2021  ? Dizziness 06/07/2021  ? Gait abnormality 12/16/2018  ? Complaints of memory disturbance 12/16/2018  ? Myalgia and myositis 01/02/2014  ? Restless legs syndrome (RLS) 12/09/2013  ? Tremor 09/09/2013  ? Scoliosis 09/15/2011  ? MUCOCELE, 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  ? HYPERLIPIDEMIA 03/08/2010  ? HERPES ZOSTER 02/28/2010  ? VAGINAL BLEEDING, ABNORMAL 12/03/2009  ? LOOSE STOOLS 12/03/2009  ? UNSPECIFIED URINARY INCONTINENCE 12/03/2009  ? WEIGHT  GAIN 10/08/2009  ? DRY SKIN 10/15/2008  ? INSOMNIA 10/15/2008  ? WEIGHT LOSS, RECENT 10/15/2008  ? VAGINAL PRURITUS 07/31/2008  ? SKIN TAG 06/26/2008  ? SEBORRHEIC KERATOSIS 06/26/2008  ? PRURITUS 05/28/2008  ? FATIGUE, CHRONIC 05/28/2008  ? DISTURBANCES OF SENSATION OF SMELL AND TASTE 05/28/2008  ? MASS, CHEST WALL 05/28/2008  ? ANEMIA-NOS 05/15/2008  ? ANXIETY 05/15/2008  ? DEPRESSION 05/15/2008  ? ? ?REFERRING DIAG: Rt arm pain with fibromyalgia  ? ?THERAPY DIAG:  ?Pain in right arm ? ?Cramp and spasm ? ?Muscle weakness (generalized) ? ?PERTINENT HISTORY: fibromyalgia, depression, anxiety  ? ?PRECAUTIONS: none ? ?SUBJECTIVE: 80% overall improvement. I'm ready for discharge.   ?PAIN:  ?Are you having pain? Yes ?NPRS scale: 0/10 ?Pain location: Rt UE ?Pain orientation: right  ?PAIN TYPE: sore, tired ?Pain description: Aggravating factors: holding phone, reaching overhead ?Relieving factors: rest ? ?OBJECTIVE:  ? ?05/18/21 ?Rt shoulder flexion: 4+/5, abduction 4/5, IR 4+/5, ER 4/5 ?06/01/21 ?Rt shoulder flexion: 4+/5, abduction 4/5, IR 4+/5, ER 4/5 ?FOTO: 44 (goal is 42) ? 08/01/21 ? FOTO 60  ?Rt shoulder flexion 4+/5, abduction 4+/5, IR 5/5, ER 4+/5 ?  ? ?TODAY'S TREATMENT:  ?08/01/21: ?Bicep curl 3# 2x10 ?3 way raise 3# 2x5 each ?Green band horizontal abd 2x8, Ext rot 2x12 with red ?Arm bike: L2 2x2  ?07/25/21: ?Bicep curl 3#  2x10 ?3 way raise 3# 5x each ?Red band horizontal abd 2x12, Ext rot 2x12 ?Arm bike: L2 2x2  ?07/13/21: pt requested limited exercise due to not feeling well today ?Seated biceps 2# 2x10 ?2# flexion to 90 degrees & scaption 2x10  ?Red band: horizontal abduction and ER 2x10                                                                                                                                                                                                                                                                                                                                                                                                                                                                                                                                                                                                                                                                                                                                                                                                                                                                                                                                                                                                                                                                                              ? ? ? ? ? ?  HOME EXERCISE PROGRAM: ?Access Code: [email or web address] ? ?Goals reviewed with patient? Yes ? ?SHORT TERM GOALS: ? ?STG Name Target Date Goal status  ?1 be independent in initial HEP  ?Baseline:   MET  ?2 report < or = to 6/10 Rt arm pain with reaching and lifting with daily tasks  ?Baseline: 4/10 06/22/21 MET  ?3 report > or = to 85% use of the Rt UE with daily tasks  ?Baseline: no limitations except reaching overhead  08/05/21 MET  ?     ?     ?     ?     ? ?LONG TERM GOALS:  ? ?LTG Name Target Date Goal status  ?1 be independent in initial HEP  ?Baseline: further advancement is needed 08/05/21 MET  ?2 improve FOTO to > or = to 63  ?Baseline: 60 (07/13/21) 08/05/21 Partially met  ?3 report > or = to 95% use of Rt UE with ADLs and self-care due to reduced pain  ?Baseline: 85-90% (07/13/21) 08/05/21 Partially met  ?4 demonstrate 4+/5 Rt shoulder strength to improve endurance with use  ?Baseline:  Rt shoulder flexion: 4+/5, abduction 4/5, IR 4+/5, ER 4+/5 08/05/21 MET  ?5 report < or = to 3/10 Rt shoulder pain with use  ?Baseline: 3-7/10 08/05/21 Partially met   ?     ?     ? ? ? ? ? ? ? ?ASSESSMENT: ? ?CLINICAL IMPRESSION:  Pt reports 85-90% use of Rt UE and is only limited with reaching overhead right now. Pt is independent in  her HEP for strength and flexibility and demonstrates improvements in both since the start of care. PT issued green band for advancement with band exercises at home.  Pt able to perform horizontal abduction with green band and was not able to ER with green.  Pt will D/C PT to HEP.  ? ? ?PLAN FOR NEXT SESSION: DC next visit. ? ?PHYSICAL THERAPY DISCHARGE SUMMARY ? ?Visits from Start of Care: 21 ? ?Current functional level related to goals / functional outcomes: ?See above for current status.  Pt has HEP in place for continued gains.   ?  ?Remaining deficits: ?Some pain with functional use and limited functional strength on the Rt. ?  ?Education / Equipment: ?HEP  ? ?Patient agrees to discharge. Patient goals were partially met. Patient is being discharged due to being pleased with the current functional level. ? ?Sigurd Sos, PT ?08/01/21 5:01 PM   ?   ?[address], [address] ?[place], [place] [address] ?Phone # [phone] ?Fax [phone]  ?

## 2021-08-01 NOTE — Telephone Encounter (Signed)
I spoke to the patient and provided her with the CT head results. EEG pending 08/08/21. ?

## 2021-08-08 ENCOUNTER — Ambulatory Visit (INDEPENDENT_AMBULATORY_CARE_PROVIDER_SITE_OTHER): Payer: Medicare Other | Admitting: Neurology

## 2021-08-08 ENCOUNTER — Telehealth: Payer: Self-pay | Admitting: Neurology

## 2021-08-08 DIAGNOSIS — R110 Nausea: Secondary | ICD-10-CM

## 2021-08-08 DIAGNOSIS — R42 Dizziness and giddiness: Secondary | ICD-10-CM

## 2021-08-08 DIAGNOSIS — R410 Disorientation, unspecified: Secondary | ICD-10-CM | POA: Diagnosis not present

## 2021-08-08 NOTE — Telephone Encounter (Signed)
[place] [facility] office calling to inform office they have gotten a fax of pt forms about 15 times. ?Just wanted to make office aware they do have it.  ?

## 2021-08-11 ENCOUNTER — Encounter: Payer: Self-pay | Admitting: Neurology

## 2021-08-11 ENCOUNTER — Ambulatory Visit (INDEPENDENT_AMBULATORY_CARE_PROVIDER_SITE_OTHER): Payer: Medicare Other | Admitting: Neurology

## 2021-08-11 VITALS — BP 146/79 | HR 74 | Ht 63.5 in | Wt 122.5 lb

## 2021-08-11 DIAGNOSIS — R7989 Other specified abnormal findings of blood chemistry: Secondary | ICD-10-CM | POA: Diagnosis not present

## 2021-08-11 DIAGNOSIS — T85695S Other mechanical complication of other nervous system device, implant or graft, sequela: Secondary | ICD-10-CM | POA: Diagnosis not present

## 2021-08-11 DIAGNOSIS — R4189 Other symptoms and signs involving cognitive functions and awareness: Secondary | ICD-10-CM | POA: Insufficient documentation

## 2021-08-11 DIAGNOSIS — E538 Deficiency of other specified B group vitamins: Secondary | ICD-10-CM | POA: Diagnosis not present

## 2021-08-11 DIAGNOSIS — T85695A Other mechanical complication of other nervous system device, implant or graft, initial encounter: Secondary | ICD-10-CM | POA: Insufficient documentation

## 2021-08-11 DIAGNOSIS — G319 Degenerative disease of nervous system, unspecified: Secondary | ICD-10-CM | POA: Diagnosis not present

## 2021-08-11 DIAGNOSIS — R799 Abnormal finding of blood chemistry, unspecified: Secondary | ICD-10-CM | POA: Diagnosis not present

## 2021-08-11 DIAGNOSIS — R700 Elevated erythrocyte sedimentation rate: Secondary | ICD-10-CM | POA: Diagnosis not present

## 2021-08-11 DIAGNOSIS — R7982 Elevated C-reactive protein (CRP): Secondary | ICD-10-CM | POA: Diagnosis not present

## 2021-08-11 NOTE — Progress Notes (Addendum)
Chief Complaint  Patient presents with   Follow-up    Rm 12. Alone. F/u per dr. Terrace Arabia- imaging.      ASSESSMENT AND PLAN  Brandi Mora is a 67 y.o. 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 Addendum: I reviewed neurosurgery evaluation by Dr. Conchita Paris August 2016, removal of VNS lead from the vagus nerve would require significant dissection, and document, do not believe that the risk of procedure would outweigh the potential improvement in hoarseness of the voice, he recommended not to remove the device or battery  MEDICAL HISTORY:  Brandi Mora is a 66 year old female, patient of Dr. Anne Hahn in the past, her primary care physician is Dr. Chanetta Marshall, Brandi Score, Brandi Mora   I reviewed and summarized the referring note.  Medical history Depression Fibromyalgia Chronic insomnia  Patient was seen by Dr. Anne Hahn since 2015, reported long history of severe depression, anxiety, treated with different neurotrophic medications, previously taking Abilify, Cogentin, Topamax, was seen for constellation of complaints, including bilateral hands tremor, fibromyalgia  Most recent visit with Dr. Anne Hahn was in March 2021, complains of irregular sleep pattern, generalized  fatigue,  Personally reviewed CT head without contrast in October 2020, evidence of out of proportion bilateral frontal atrophy, mild progression compared to previous scan in 2016  She is now on polypharmacy treatment, including BuSpar 30 mg twice a day, Prozac 60 mg, lamotrigine 200 mg every day, also taking Requip 0.251/2 tablets daily for restless leg symptoms, patient reported that" her mind is in a good place"  Today her main complaint is couple years history of gradual onset motion sensitivity, she could not tolerate seeing people sitting on the rocking chair, she felt dizzy lightheaded, rapid change in the TV screen would cause her similar sensation, she has nauseated, sometimes vomiting in the morning time, gradually getting worse, now it bothers her every day, she is taking frequent over-the-counter Dramamine, which has helped her some  UPDATE Aug 11 2021: VNS was not help, affecting her voice, not going back to normal, it was done at [place] at [facility], it wa done in 2006,  She does not want it to be checked.  Brandi Mora    PHYSICAL EXAM:   Vitals:   08/11/21 1137  BP: (!) 146/79  Pulse: 74  Weight: 122 lb 8 oz (55.6 kg)  Height: 5' 3.5" (1.613 m)   Not recorded     Body mass index is 21.36 kg/m.  PHYSICAL EXAMNIATION:  Gen: NAD, conversant, well nourised, well groomed                     Cardiovascular: Regular rate rhythm, no peripheral edema, warm, nontender. Eyes: Conjunctivae clear without exudates or hemorrhage Neck: Supple, no carotid bruits. Pulmonary: Clear to auscultation bilaterally   NEUROLOGICAL EXAM:  MENTAL STATUS: Speech:  Speech is normal; fluent and spontaneous with normal comprehension.  Cognition:    08/11/2021   12:00 PM  Montreal Cognitive Assessment   Visuospatial/ Executive (0/5) 4  Naming (0/3) 3  Attention: Read list of digits (0/2) 2  Attention: Read list of letters (0/1) 1  Attention: Serial 7 subtraction starting at 100  (0/3) 3  Language: Repeat phrase (0/2) 2  Language : Fluency (0/1) 1  Abstraction (0/2) 2  Delayed Recall (0/5) 2  Orientation (0/6) 6  Total 26     CRANIAL NERVES: CN II: Visual fields are full to confrontation. Pupils are round equal and briskly reactive to light. CN III, IV, VI: extraocular movement are normal. No ptosis. CN V: Facial sensation is intact to light touch CN VII: Face is symmetric with normal eye closure  CN VIII: Hearing is normal to causal conversation. CN IX, X: Phonation is normal. CN XI: Head turning and shoulder shrug are intact  MOTOR: There is no pronator drift of out-stretched arms. Muscle bulk and tone are normal. Muscle strength is normal.  REFLEXES: Reflexes are 2+ and symmetric at the biceps, triceps, knees, and ankles. Plantar responses are flexor.  SENSORY: Intact to light touch, pinprick and vibratory sensation are intact in fingers and toes.  COORDINATION: There is no trunk or limb dysmetria noted.  GAIT/STANCE: Posture is normal. Gait is steady with normal steps, base, arm swing, and turning. Heel and toe walking are normal. Tandem gait is normal.  Romberg is absent.  REVIEW OF SYSTEMS:  Full 14 system review of systems performed and notable only for as above All other review of systems were negative.   ALLERGIES: Allergies  Allergen Reactions   Elavil [Amitriptyline Hcl] Nausea And Vomiting, Nausea Only and Other (See Comments)    Dizziness;crying   Wellbutrin [Bupropion Hcl] Other (See Comments)    Confusion    HOME MEDICATIONS: Current Outpatient Medications  Medication Sig Dispense Refill   Acetaminophen (TYLENOL PO) Take by mouth.     Apoaequorin (PREVAGEN PO) Take 1 tablet by mouth daily.     busPIRone (BUSPAR) 30 MG tablet Take 1 tablet (30 mg total) by mouth 2 (two) times daily. 60 tablet 3   caffeine 200 MG TABS tablet Take 200 mg by mouth 3 (three) times daily.     dimenhyDRINATE (DRAMAMINE) 50 MG tablet Take 50 mg  by mouth every 8 (eight) hours as needed.     FLUoxetine (PROZAC) 20 MG capsule Take 3 capsules (60 mg total) by mouth daily. 90 capsule 3   lamoTRIgine (LAMICTAL) 200 MG tablet Take 1 tablet (200 mg total) by mouth daily. Take 1/2 tablet in the morning and 1 tablet in the evening 45 tablet 3   Multiple Vitamins-Minerals (CENTRUM ADULTS PO) Take 1 tablet by mouth daily.     omeprazole (PRILOSEC) 40 MG capsule Take 40 mg by mouth daily.     QUEtiapine (SEROQUEL) 50 MG tablet Take 50 mg by mouth at bedtime.     rOPINIRole (REQUIP) 0.25 MG tablet Take 0.5 mg by mouth 3 (three) times daily.      No current facility-administered medications for this visit.    PAST MEDICAL HISTORY: Past Medical History:  Diagnosis Date   Anemia    Anxiety    Depression    Fibromyalgia    High cholesterol    Mildly obese    Myalgia and myositis 01/02/2014   Restless legs syndrome (RLS) 12/09/2013   Tremor 09/09/2013    PAST SURGICAL  HISTORY: Past Surgical History:  Procedure Laterality Date   COSMETIC SURGERY  1976   dermoid cyst removal  1990   OOPHORECTOMY     vagal nerve stimulator placement Left     FAMILY HISTORY: Family History  Problem Relation Age of Onset   Heart disease Mother        enlarged heart   Hypertension Mother    Cancer Mother        ovarian   Cancer Father        Prostate/skin   Diabetes Father        type 2   Alzheimer's disease Father    Cancer Maternal Grandfather        prostate    Breast cancer Paternal Aunt    Tremor Neg Hx     SOCIAL HISTORY: Social History   Socioeconomic History   Marital status: Single    Spouse name: Not on file   Number of children: 0   Years of education: college   Highest education level: Master's degree (e.g., MA, MS, MEng, MEd, MSW, MBA)  Occupational History   Occupation: unemployed    Employer: DISABLED  Tobacco Use   Smoking status: Never   Smokeless tobacco: Never  Vaping Use   Vaping Use: Never used  Substance and  Sexual Activity   Alcohol use: Yes    Comment: rarely in small amounts   Drug use: No   Sexual activity: Not on file  Other Topics Concern   Not on file  Social History Narrative   Patient is single and lives alone.   Patient does not have any children.   Patient is on disability.   Patient is right-handed.   Patient take Jet Alert, which has caffeine.   Exercise - 3 times /wk for 3 hours doing water aerobics   100-200 mg of caffeine daily (takes pills)    Social Determinants of Health   Financial Resource Strain: Not on file  Food Insecurity: Not on file  Transportation Needs: Not on file  Physical Activity: Not on file  Stress: Not on file  Social Connections: Not on file  Intimate Partner Violence: Not on file      Levert Feinstein, M.D. Ph.D.  [facility] [address] [place], [place] [address] Ph: [phone] Fax: [phone]  CC:  Shon Hale, Brandi Mora [address] [place],  [place] [address]  Shon Hale, Brandi Mora

## 2021-08-11 NOTE — Procedures (Signed)
? ?  HISTORY: 66 years complains memory loss, motion sensitivity ? ?TECHNIQUE:  ?This is a routine 16 channel EEG recording with one channel devoted to a limited EKG recording.  It was performed during wakefulness, drowsiness and asleep.  Hyperventilation and photic stimulation were performed as activating procedures.  There are frequent muscle and movement artifact noted. ? ?Upon maximum arousal, posterior dominant waking rhythm consistent of rhythmic alpha range activity, with frequency of 10 Hz. Activities are symmetric over the bilateral posterior derivations and attenuated with eye opening. ? ?Hyperventilation produced mild/moderate buildup with higher amplitude and the slower activities noted. ? ?Photic stimulation did not alter the tracing. ? ?During EEG recording, patient developed drowsiness and no sleep was achieved. ? ?During EEG recording, there was no epileptiform discharge noted. ? ?EKG demonstrate sinus rhythm, with heart rate of 72 bpm ? ?CONCLUSION: ?This is a  normal awake EEG.  There is no electrodiagnostic evidence of epileptiform discharge. ? ?Levert Feinstein, M.D. Ph.D. ? ?[facility] ?[address] ?[place], [place] [address] ?Phone: [phone] ?Fax:      [phone]  ?

## 2021-08-12 LAB — COMPREHENSIVE METABOLIC PANEL
ALT: 20 IU/L (ref 0–32)
AST: 23 IU/L (ref 0–40)
Albumin/Globulin Ratio: 1.8 (ref 1.2–2.2)
Albumin: 4.7 g/dL (ref 3.8–4.8)
Alkaline Phosphatase: 105 IU/L (ref 44–121)
BUN/Creatinine Ratio: 15 (ref 12–28)
BUN: 14 mg/dL (ref 8–27)
Bilirubin Total: <0.2 mg/dL (ref 0.0–1.2)
CO2: 22 mmol/L (ref 20–29)
Calcium: 9.3 mg/dL (ref 8.7–10.3)
Chloride: 102 mmol/L (ref 96–106)
Creatinine, Ser: 0.93 mg/dL (ref 0.57–1.00)
Globulin, Total: 2.6 g/dL (ref 1.5–4.5)
Glucose: 89 mg/dL (ref 70–99)
Potassium: 4.2 mmol/L (ref 3.5–5.2)
Sodium: 140 mmol/L (ref 134–144)
Total Protein: 7.3 g/dL (ref 6.0–8.5)
eGFR: 67 mL/min/{1.73_m2} (ref >59)

## 2021-08-12 LAB — TSH: TSH: 1.53 u[IU]/mL (ref 0.450–4.500)

## 2021-08-12 LAB — CBC WITH DIFFERENTIAL/PLATELET
Basophils Absolute: 0.1 10*3/uL (ref 0.0–0.2)
Basos: 1 %
EOS (ABSOLUTE): 0.1 10*3/uL (ref 0.0–0.4)
Eos: 3 %
Hematocrit: 39.6 % (ref 34.0–46.6)
Hemoglobin: 13.7 g/dL (ref 11.1–15.9)
Immature Grans (Abs): 0 10*3/uL (ref 0.0–0.1)
Immature Granulocytes: 0 %
Lymphocytes Absolute: 1.1 10*3/uL (ref 0.7–3.1)
Lymphs: 24 %
MCH: 32.3 pg (ref 26.6–33.0)
MCHC: 34.6 g/dL (ref 31.5–35.7)
MCV: 93 fL (ref 79–97)
Monocytes Absolute: 0.4 10*3/uL (ref 0.1–0.9)
Monocytes: 9 %
Neutrophils Absolute: 2.8 10*3/uL (ref 1.4–7.0)
Neutrophils: 63 %
Platelets: 327 10*3/uL (ref 150–450)
RBC: 4.24 x10E6/uL (ref 3.77–5.28)
RDW: 13.1 % (ref 11.7–15.4)
WBC: 4.5 10*3/uL (ref 3.4–10.8)

## 2021-08-12 LAB — HIV ANTIBODY (ROUTINE TESTING W REFLEX): HIV Screen 4th Generation wRfx: NONREACTIVE

## 2021-08-12 LAB — RPR: RPR Ser Ql: NONREACTIVE

## 2021-08-12 LAB — VITAMIN B12: Vitamin B-12: 1896 pg/mL — ABNORMAL HIGH (ref 232–1245)

## 2021-08-12 LAB — SEDIMENTATION RATE: Sed Rate: 22 mm/hr (ref 0–40)

## 2021-08-12 LAB — C-REACTIVE PROTEIN: CRP: 2 mg/L (ref 0–10)

## 2021-08-15 ENCOUNTER — Telehealth: Payer: Self-pay | Admitting: Neurology

## 2021-08-15 NOTE — Telephone Encounter (Signed)
Sent to [facility] ph # [phone]. 

## 2021-09-02 DIAGNOSIS — R3121 Asymptomatic microscopic hematuria: Secondary | ICD-10-CM | POA: Diagnosis not present

## 2021-09-02 DIAGNOSIS — R350 Frequency of micturition: Secondary | ICD-10-CM | POA: Diagnosis not present

## 2021-09-21 DIAGNOSIS — R3121 Asymptomatic microscopic hematuria: Secondary | ICD-10-CM | POA: Diagnosis not present

## 2021-09-21 DIAGNOSIS — R319 Hematuria, unspecified: Secondary | ICD-10-CM | POA: Diagnosis not present

## 2021-09-30 DIAGNOSIS — R350 Frequency of micturition: Secondary | ICD-10-CM | POA: Diagnosis not present

## 2021-09-30 DIAGNOSIS — R3121 Asymptomatic microscopic hematuria: Secondary | ICD-10-CM | POA: Diagnosis not present

## 2021-10-10 ENCOUNTER — Other Ambulatory Visit: Payer: Self-pay | Admitting: Family Medicine

## 2021-10-10 ENCOUNTER — Ambulatory Visit
Admission: RE | Admit: 2021-10-10 | Discharge: 2021-10-10 | Disposition: A | Discharge status: Home / Self Care | Payer: Medicare Other | Source: Ambulatory Visit | Attending: Family Medicine | Admitting: Family Medicine

## 2021-10-10 DIAGNOSIS — E2839 Other primary ovarian failure: Secondary | ICD-10-CM

## 2021-10-10 DIAGNOSIS — Z780 Asymptomatic menopausal state: Secondary | ICD-10-CM | POA: Diagnosis not present

## 2021-10-10 DIAGNOSIS — Z1231 Encounter for screening mammogram for malignant neoplasm of breast: Secondary | ICD-10-CM

## 2021-10-11 ENCOUNTER — Ambulatory Visit: Payer: Medicare Other

## 2021-10-18 ENCOUNTER — Ambulatory Visit
Admission: RE | Admit: 2021-10-18 | Discharge: 2021-10-18 | Disposition: A | Discharge status: Home / Self Care | Payer: Medicare Other | Source: Ambulatory Visit | Attending: Family Medicine | Admitting: Family Medicine

## 2021-10-18 DIAGNOSIS — Z1231 Encounter for screening mammogram for malignant neoplasm of breast: Secondary | ICD-10-CM

## 2021-11-02 DIAGNOSIS — R42 Dizziness and giddiness: Secondary | ICD-10-CM | POA: Diagnosis not present

## 2021-11-02 DIAGNOSIS — R413 Other amnesia: Secondary | ICD-10-CM | POA: Diagnosis not present

## 2021-11-16 DIAGNOSIS — T85695A Other mechanical complication of other nervous system device, implant or graft, initial encounter: Secondary | ICD-10-CM | POA: Diagnosis not present

## 2022-04-21 DIAGNOSIS — R7303 Prediabetes: Secondary | ICD-10-CM | POA: Diagnosis not present

## 2022-04-21 DIAGNOSIS — Z0001 Encounter for general adult medical examination with abnormal findings: Secondary | ICD-10-CM | POA: Diagnosis not present

## 2022-04-21 DIAGNOSIS — Z79899 Other long term (current) drug therapy: Secondary | ICD-10-CM | POA: Diagnosis not present

## 2022-04-21 DIAGNOSIS — G2581 Restless legs syndrome: Secondary | ICD-10-CM | POA: Diagnosis not present

## 2022-05-12 ENCOUNTER — Other Ambulatory Visit: Payer: Self-pay

## 2022-05-12 ENCOUNTER — Emergency Department (HOSPITAL_BASED_OUTPATIENT_CLINIC_OR_DEPARTMENT_OTHER)
Admission: EM | Admit: 2022-05-12 | Discharge: 2022-05-12 | Disposition: A | Discharge status: Home / Self Care | Payer: 59 | Attending: Emergency Medicine | Admitting: Emergency Medicine

## 2022-05-12 ENCOUNTER — Encounter (HOSPITAL_BASED_OUTPATIENT_CLINIC_OR_DEPARTMENT_OTHER): Payer: Self-pay

## 2022-05-12 ENCOUNTER — Emergency Department (HOSPITAL_BASED_OUTPATIENT_CLINIC_OR_DEPARTMENT_OTHER): Payer: 59

## 2022-05-12 ENCOUNTER — Other Ambulatory Visit (HOSPITAL_BASED_OUTPATIENT_CLINIC_OR_DEPARTMENT_OTHER): Payer: Self-pay

## 2022-05-12 DIAGNOSIS — R0789 Other chest pain: Secondary | ICD-10-CM | POA: Insufficient documentation

## 2022-05-12 DIAGNOSIS — R079 Chest pain, unspecified: Secondary | ICD-10-CM | POA: Diagnosis not present

## 2022-05-12 LAB — CBC WITH DIFFERENTIAL/PLATELET
Abs Immature Granulocytes: 0.01 10*3/uL (ref 0.00–0.07)
Basophils Absolute: 0 10*3/uL (ref 0.0–0.1)
Basophils Relative: 1 %
Eosinophils Absolute: 0.1 10*3/uL (ref 0.0–0.5)
Eosinophils Relative: 4 %
HCT: 38.5 % (ref 36.0–46.0)
Hemoglobin: 12.9 g/dL (ref 12.0–15.0)
Immature Granulocytes: 0 %
Lymphocytes Relative: 27 %
Lymphs Abs: 1.1 10*3/uL (ref 0.7–4.0)
MCH: 32.6 pg (ref 26.0–34.0)
MCHC: 33.5 g/dL (ref 30.0–36.0)
MCV: 97.2 fL (ref 80.0–100.0)
Monocytes Absolute: 0.3 10*3/uL (ref 0.1–1.0)
Monocytes Relative: 7 %
Neutro Abs: 2.4 10*3/uL (ref 1.7–7.7)
Neutrophils Relative %: 61 %
Platelets: 253 10*3/uL (ref 150–400)
RBC: 3.96 MIL/uL (ref 3.87–5.11)
RDW: 12.6 % (ref 11.5–15.5)
WBC: 3.9 10*3/uL — ABNORMAL LOW (ref 4.0–10.5)
nRBC: 0 % (ref 0.0–0.2)

## 2022-05-12 LAB — BASIC METABOLIC PANEL
Anion gap: 9 (ref 5–15)
BUN: 18 mg/dL (ref 8–23)
CO2: 27 mmol/L (ref 22–32)
Calcium: 9.9 mg/dL (ref 8.9–10.3)
Chloride: 103 mmol/L (ref 98–111)
Creatinine, Ser: 0.91 mg/dL (ref 0.44–1.00)
GFR, Estimated: >60 mL/min (ref >60)
Glucose, Bld: 102 mg/dL — ABNORMAL HIGH (ref 70–99)
Potassium: 4 mmol/L (ref 3.5–5.1)
Sodium: 139 mmol/L (ref 135–145)

## 2022-05-12 LAB — TROPONIN I (HIGH SENSITIVITY)
Troponin I (High Sensitivity): 2 ng/L (ref <18)
Troponin I (High Sensitivity): 3 ng/L (ref <18)

## 2022-05-12 NOTE — Discharge Instructions (Signed)
You have been seen today for your complaint of mild chest pain. Your lab work was reassuring and showed no abnormalities. Your imaging was reassuring and showed no abnormalities. Your discharge medications include your home medications. Follow up with: Your primary care provider in 1 week Please seek immediate medical care if you develop any of the following symptoms: Your chest pain gets worse. You have a cough that gets worse, or you cough up blood. You have severe pain in your abdomen. You faint. You have sudden, unexplained chest discomfort. You have sudden, unexplained discomfort in your arms, back, neck, or jaw. You have shortness of breath at any time. You suddenly start to sweat, or your skin gets clammy. You feel nausea or you vomit. You suddenly feel lightheaded or dizzy. You have severe weakness, or unexplained weakness or fatigue. Your heart begins to beat quickly, or it feels like it is skipping beats. At this time there does not appear to be the presence of an emergent medical condition, however there is always the potential for conditions to change. Please read and follow the below instructions.  Do not take your medicine if  develop an itchy rash, swelling in your mouth or lips, or difficulty breathing; call 911 and seek immediate emergency medical attention if this occurs.  You may review your lab tests and imaging results in their entirety on your MyChart account.  Please discuss all results of fully with your primary care provider and other specialist at your follow-up visit.  Note: Portions of this text may have been transcribed using voice recognition software. Every effort was made to ensure accuracy; however, inadvertent computerized transcription errors may still be present.

## 2022-05-12 NOTE — ED Notes (Signed)
Pt A&OX4 ambulatory at d/c with independent steady gait. Pt verbalized understanding of d/c instructions and follow up care. 

## 2022-05-12 NOTE — ED Provider Notes (Signed)
[facility] Provider Note   CSN: [id number] Arrival date & time: 05/12/22  1152     History  Chief Complaint  Patient presents with   Chest Pain    Brandi Mora is a 67 y.o. female.  With a history of anxiety, depression, fibromyalgia, hypercholesterolemia, anemia who presents to the ED for evaluation of right-sided chest pain.  She states this began shortly after waking up this morning.  It has actually improved throughout the day.  She currently rates it at a 3.5 out of 10.  It is not exertional, it actually improves with exertion.  Reports hypercholesterolemia but denies all other cardiac diseases.  Denies family history of cardiac disease.  Denies associated cough, shortness of breath, nausea, vomiting, diaphoresis, abdominal pain.  She states she was at her therapy appointment when her psychologist advised her to come to the ED for further evaluation.  She describes it as a mild aching sensation without radiation.   Chest Pain      Home Medications Prior to Admission medications   Medication Sig Start Date End Date Taking? Authorizing Provider  Acetaminophen (TYLENOL PO) Take by mouth.    [provider]  Apoaequorin (PREVAGEN PO) Take 1 tablet by mouth daily.    [provider]  busPIRone (BUSPAR) 30 MG tablet Take 1 tablet (30 mg total) by mouth 2 (two) times daily. 11/13/11   Le, Thao P, DO  caffeine 200 MG TABS tablet Take 200 mg by mouth 3 (three) times daily.    [provider]  dimenhyDRINATE (DRAMAMINE) 50 MG tablet Take 50 mg by mouth every 8 (eight) hours as needed.    [provider]  FLUoxetine (PROZAC) 20 MG capsule Take 3 capsules (60 mg total) by mouth daily. 11/13/11   Le, Thao P, DO  FLUoxetine HCl 60 MG TABS Take 1 tablet by mouth daily. 04/23/22   [provider]  lamoTRIgine (LAMICTAL) 200 MG tablet Take 1 tablet (200 mg total) by mouth daily. Take 1/2 tablet in the  morning and 1 tablet in the evening 11/13/11   Le, Thao P, DO  Multiple Vitamins-Minerals (CENTRUM ADULTS PO) Take 1 tablet by mouth daily.    [provider]  omeprazole (PRILOSEC) 40 MG capsule Take 40 mg by mouth daily. 03/01/21   [provider]  QUEtiapine (SEROQUEL) 50 MG tablet Take 50 mg by mouth at bedtime. 07/30/21   [provider]  rOPINIRole (REQUIP) 0.25 MG tablet Take 0.5 mg by mouth 3 (three) times daily.     [provider]      Allergies    Elavil [amitriptyline hcl] and Wellbutrin [bupropion hcl]    Review of Systems   Review of Systems  Cardiovascular:  Positive for chest pain.  All other systems reviewed and are negative.   Physical Exam Updated Vital Signs BP 129/69   Pulse 64   Temp 98.4 F (36.9 C) (Oral)   Resp 18   Ht 5' 3.5" (1.613 m)   Wt 55.6 kg   SpO2 99%   BMI 21.37 kg/m  Physical Exam Vitals and nursing note reviewed.  Constitutional:      General: She is not in acute distress.    Appearance: She is well-developed. She is not ill-appearing, toxic-appearing or diaphoretic.     Comments: Resting comfortably in bed  HENT:     Head: Normocephalic and atraumatic.  Eyes:     Conjunctiva/sclera: Conjunctivae normal.  Neck:  Vascular: No JVD.  Cardiovascular:     Rate and Rhythm: Normal rate and regular rhythm.     Pulses:          Carotid pulses are 2+ on the right side and 2+ on the left side.    Heart sounds: No murmur heard. Pulmonary:     Effort: Pulmonary effort is normal. No respiratory distress.     Breath sounds: Normal breath sounds. No decreased breath sounds, wheezing, rhonchi or rales.  Chest:     Chest wall: No tenderness.  Abdominal:     Palpations: Abdomen is soft.     Tenderness: There is no abdominal tenderness.  Musculoskeletal:        General: No swelling.     Cervical back: Neck supple.     Right lower leg: No edema.     Left lower leg: No edema.  Skin:    General: Skin is warm  and dry.     Capillary Refill: Capillary refill takes less than 2 seconds.  Neurological:     General: No focal deficit present.     Mental Status: She is alert and oriented to person, place, and time.  Psychiatric:        Mood and Affect: Mood normal.        Behavior: Behavior normal.     ED Results / Procedures / Treatments   Labs (all labs ordered are listed, but only abnormal results are displayed) Labs Reviewed  CBC WITH DIFFERENTIAL/PLATELET - Abnormal; Notable for the following components:      Result Value   WBC 3.9 (*)    All other components within normal limits  BASIC METABOLIC PANEL - Abnormal; Notable for the following components:   Glucose, Bld 102 (*)    All other components within normal limits  TROPONIN I (HIGH SENSITIVITY)  TROPONIN I (HIGH SENSITIVITY)    EKG EKG Interpretation  Date/Time:  Friday May 12 2022 12:04:46 EST Ventricular Rate:  69 PR Interval:  152 QRS Duration: 90 QT Interval:  437 QTC Calculation: 469 R Axis:   74 Text Interpretation: Sinus rhythm Confirmed by Lennice Sites ([id number]) on 05/12/2022 1:23:00 PM  Radiology DG Chest Portable 1 View  Result Date: 05/12/2022 CLINICAL DATA:  Pain. EXAM: PORTABLE CHEST 1 VIEW COMPARISON:  None Available. FINDINGS: Vagal nerve stimulator projects over the left neck with generator over the left chest. Clear lungs. Normal heart size. Tortuosity of the descending thoracic aorta. No pleural effusion or pneumothorax. Visualized bones and upper abdomen are unremarkable. IMPRESSION: No evidence of acute cardiopulmonary disease. Electronically Signed   By: Emmit Alexanders M.D.   On: 05/12/2022 12:19    Procedures Procedures    Medications Ordered in ED Medications - No data to display  ED Course/ Medical Decision Making/ A&P             HEART Score: 3                Medical Decision Making Amount and/or Complexity of Data Reviewed Labs: ordered. Radiology: ordered.  This patient presents to  the ED for concern of chest pain, this involves an extensive number of treatment options, and is a complaint that carries with it a high risk of complications and morbidity.  The emergent differential diagnosis of chest pain includes: Acute coronary syndrome, pericarditis, aortic dissection, pulmonary embolism, tension pneumothorax, and esophageal rupture.  I do not believe the patient has an emergent cause of chest pain, other urgent/non-acute considerations include, but  are not limited to: chronic angina, aortic stenosis, cardiomyopathy, myocarditis, mitral valve prolapse, pulmonary hypertension, hypertrophic obstructive cardiomyopathy (HOCM), aortic insufficiency, right ventricular hypertrophy, pneumonia, pleuritis, bronchitis, pneumothorax, tumor, gastroesophageal reflux disease (GERD), esophageal spasm, Mallory-Weiss syndrome, peptic ulcer disease, biliary disease, pancreatitis, functional gastrointestinal pain, cervical or thoracic disk disease or arthritis, shoulder arthritis, costochondritis, subacromial bursitis, anxiety or panic attack, herpes zoster, breast disorders, chest wall tumors, thoracic outlet syndrome, mediastinitis.   Co morbidities that complicate the patient evaluation  anxiety, depression, fibromyalgia, hypercholesterolemia, anemia  My initial workup includes ACS rule out  Additional history obtained from: Nursing notes from this visit.  I ordered, reviewed and interpreted labs which include: CBC, BMP, troponin, delta troponin.  Labs unremarkable.  Troponin is negative and flat  I ordered imaging studies including chest x-ray I independently visualized and interpreted imaging which showed normal I agree with the radiologist interpretation  Cardiac Monitoring:  The patient was maintained on a cardiac monitor.  I personally viewed and interpreted the cardiac monitored which showed an underlying rhythm of: NSR  Afebrile, hemodynamically stable.  67 year old female  presents ED for evaluation of mild right-sided chest pain.  On exam, she is resting comfortably in bed.  She appears overall very well.  Her lab workup was negative.  Her troponins were negative and flat.  Her EKG shows no ischemic changes.  Her chest x-ray was normal.  Heart score 3 and I have low suspicion for ACS.  Patient was encouraged to follow-up with her primary care provider in 1 week for reevaluation.  Stable at discharge.  At this time there does not appear to be any evidence of an acute emergency medical condition and the patient appears stable for discharge with appropriate outpatient follow up. Diagnosis was discussed with patient who verbalizes understanding of care plan and is agreeable to discharge. I have discussed return precautions with patient who verbalizes understanding. Patient encouraged to follow-up with their PCP within 1 week. All questions answered.  Patient's case discussed with Dr. Ronnald Nian who agrees with plan to discharge with follow-up.   Note: Portions of this report may have been transcribed using voice recognition software. Every effort was made to ensure accuracy; however, inadvertent computerized transcription errors may still be present.        Final Clinical Impression(s) / ED Diagnoses Final diagnoses:  Atypical chest pain    Rx / DC Orders ED Discharge Orders     None         Roylene Reason, PA-C 05/12/22 [place], [place], DO 05/12/22 Vernelle Emerald

## 2022-05-12 NOTE — ED Triage Notes (Signed)
Patient here POV from Home.  Endorses Right Sided CP that the Patient noted upon awakening. Subsided somewhat and then returned 1-2 Hours ago.  Mostly Achy, No SOB.   NAD Noted during Triage. A&Ox4. GCS 15. Ambulatory.

## 2022-05-26 DIAGNOSIS — R112 Nausea with vomiting, unspecified: Secondary | ICD-10-CM | POA: Diagnosis not present

## 2022-05-26 DIAGNOSIS — K269 Duodenal ulcer, unspecified as acute or chronic, without hemorrhage or perforation: Secondary | ICD-10-CM | POA: Diagnosis not present

## 2022-06-20 DIAGNOSIS — R0789 Other chest pain: Secondary | ICD-10-CM | POA: Diagnosis not present

## 2022-06-20 DIAGNOSIS — M25511 Pain in right shoulder: Secondary | ICD-10-CM | POA: Diagnosis not present

## 2022-06-20 DIAGNOSIS — R413 Other amnesia: Secondary | ICD-10-CM | POA: Diagnosis not present

## 2022-06-20 DIAGNOSIS — G319 Degenerative disease of nervous system, unspecified: Secondary | ICD-10-CM | POA: Diagnosis not present

## 2022-08-16 DIAGNOSIS — M25511 Pain in right shoulder: Secondary | ICD-10-CM | POA: Diagnosis not present

## 2022-09-06 DIAGNOSIS — M25511 Pain in right shoulder: Secondary | ICD-10-CM | POA: Diagnosis not present

## 2022-09-14 ENCOUNTER — Other Ambulatory Visit: Payer: Self-pay | Admitting: Family Medicine

## 2022-09-14 DIAGNOSIS — Z1231 Encounter for screening mammogram for malignant neoplasm of breast: Secondary | ICD-10-CM

## 2022-09-18 DIAGNOSIS — M7581 Other shoulder lesions, right shoulder: Secondary | ICD-10-CM | POA: Diagnosis not present

## 2022-09-20 DIAGNOSIS — M7541 Impingement syndrome of right shoulder: Secondary | ICD-10-CM | POA: Diagnosis not present

## 2022-09-23 IMAGING — MG MM DIGITAL SCREENING BILAT W/ TOMO AND CAD
8 series · 8 of 24 positions shown · non-contrast
Comparison: Previous exam(s).

CLINICAL DATA: Screening.

EXAM:
DIGITAL SCREENING BILATERAL MAMMOGRAM WITH TOMOSYNTHESIS AND CAD
TECHNIQUE: Bilateral screening digital craniocaudal and mediolateral oblique
mammograms were obtained. Bilateral screening digital breast
tomosynthesis was performed. The images were evaluated with
computer-aided detection.

[R CC synth-2D]
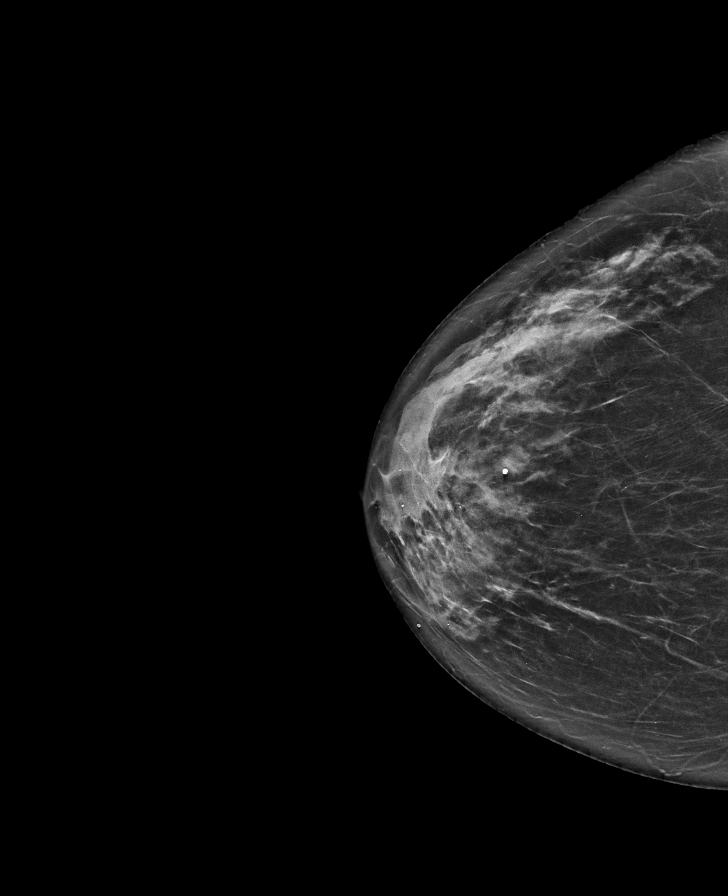

[R MLO synth-2D]
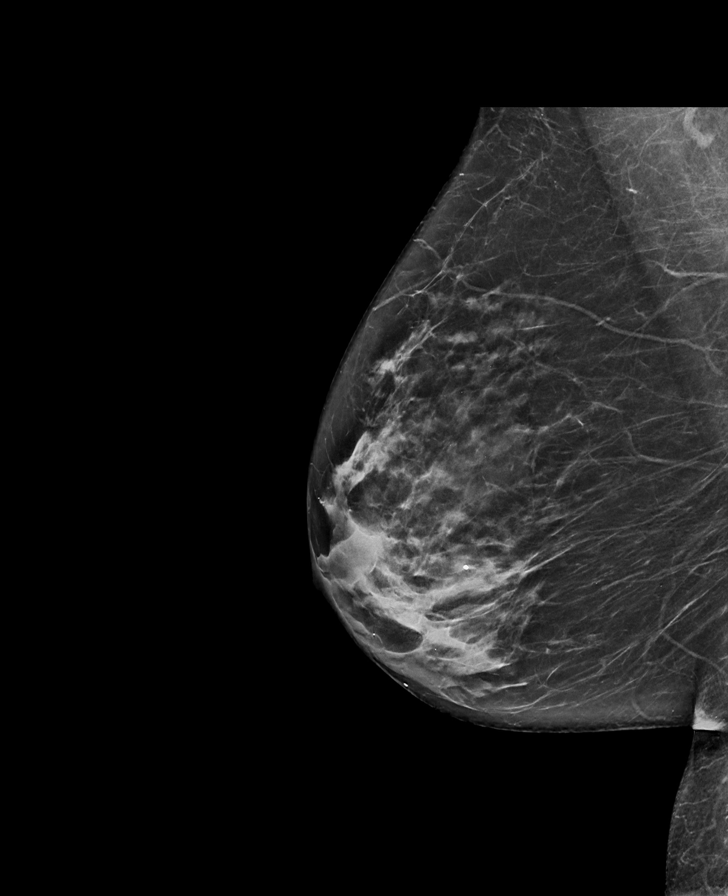

[L MLO synth-2D]
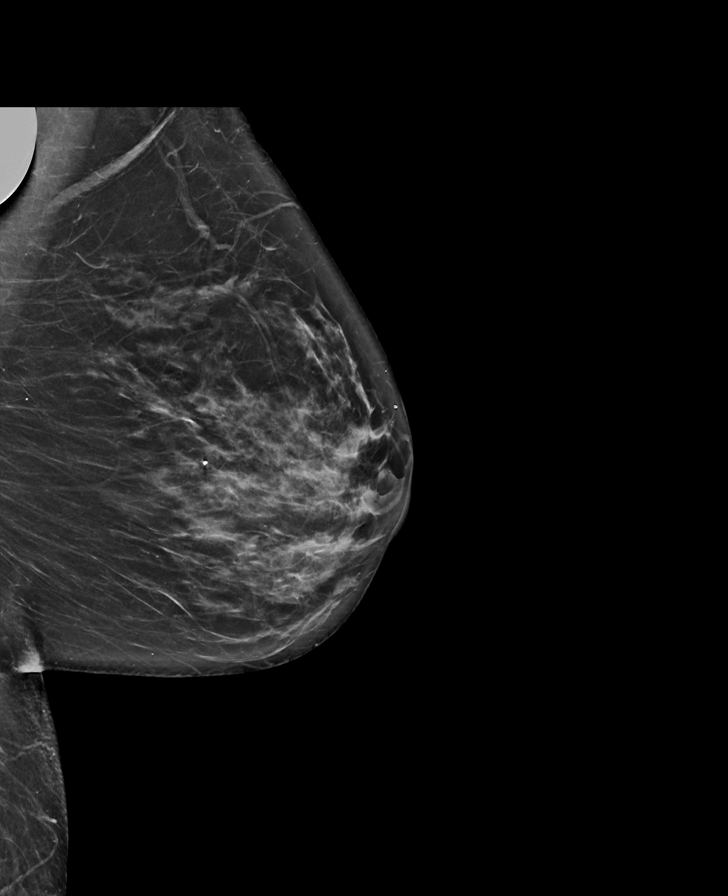

[L CC synth-2D]
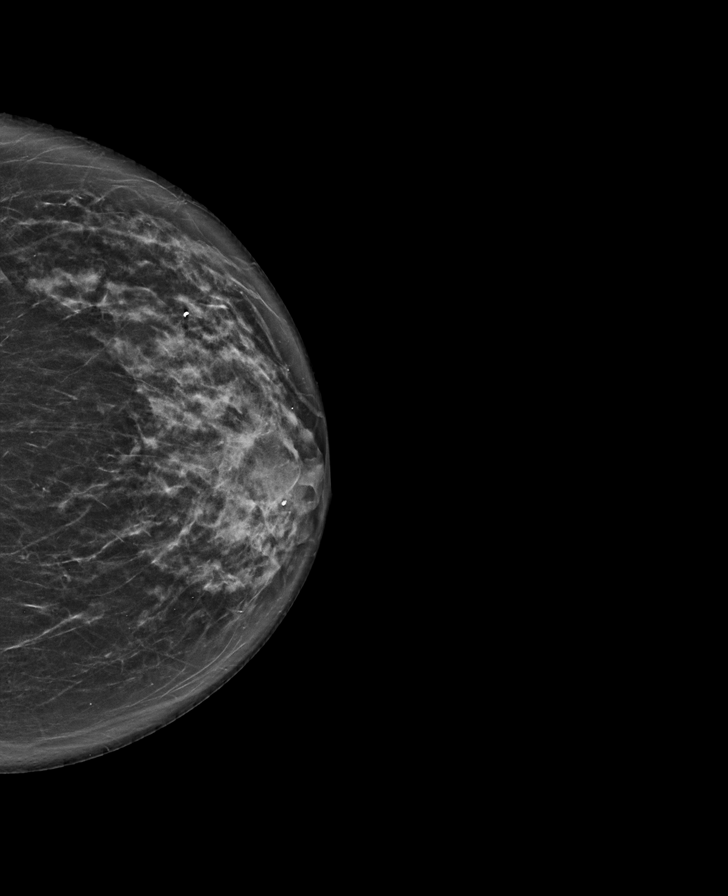

[L CC tomo · tomo slice 33/64.0]
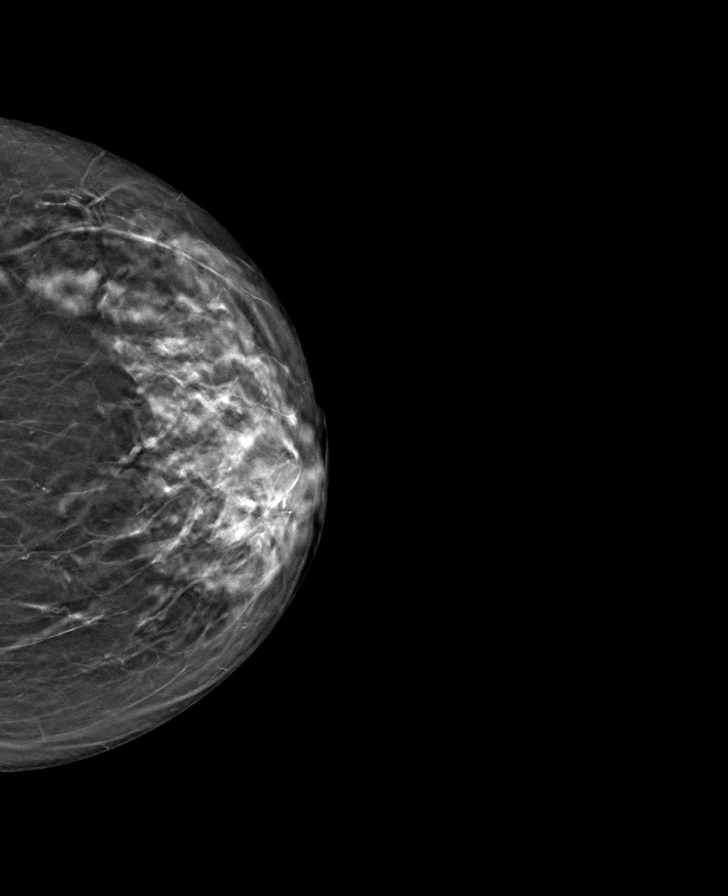

[R CC tomo · tomo slice 33/66.0]
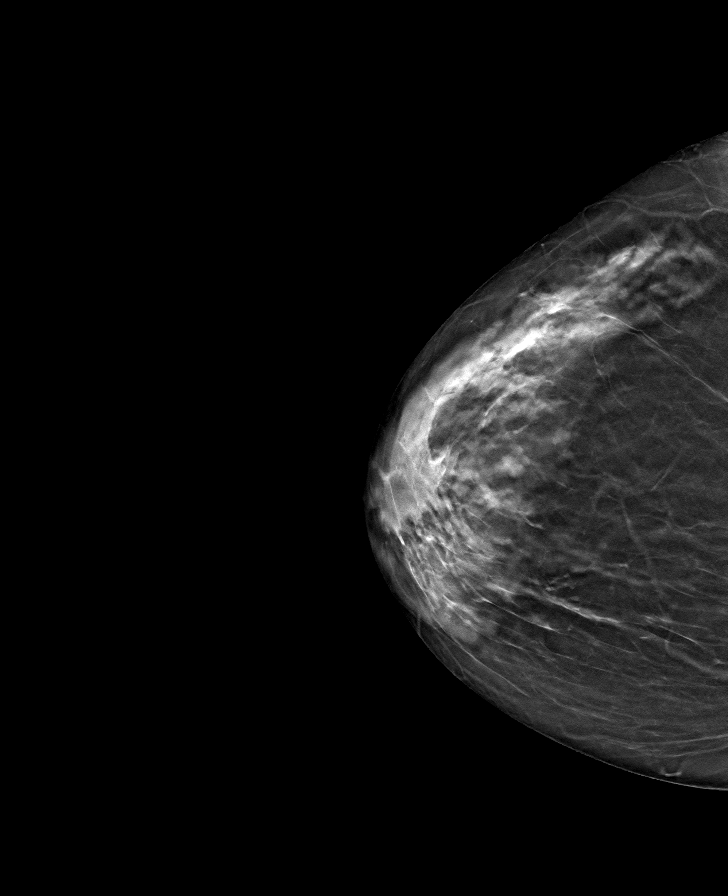

[L MLO tomo · tomo slice 35/69.0]
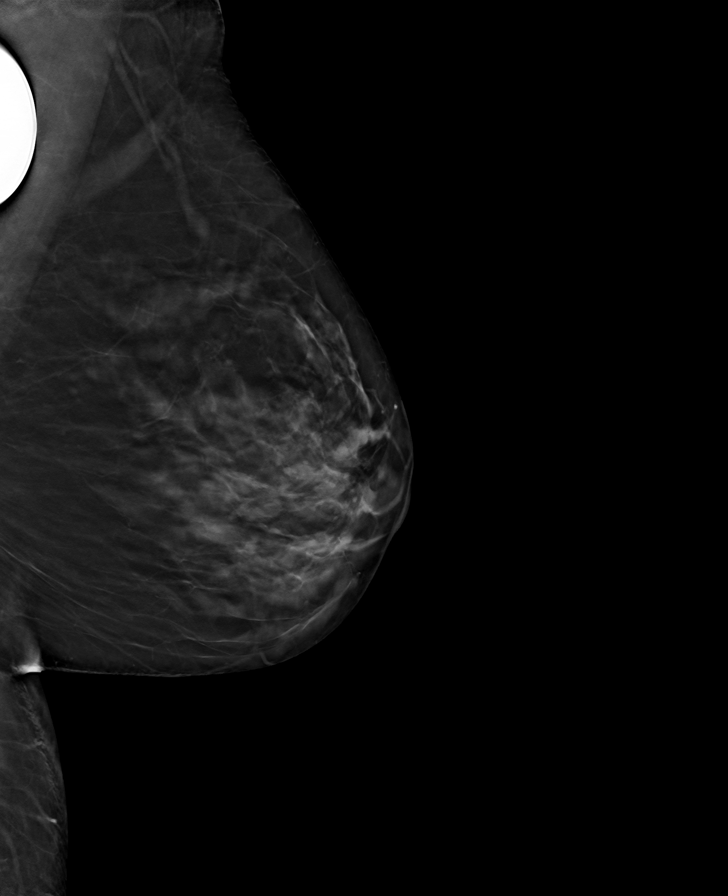

[R MLO tomo · tomo slice 35/69.0]
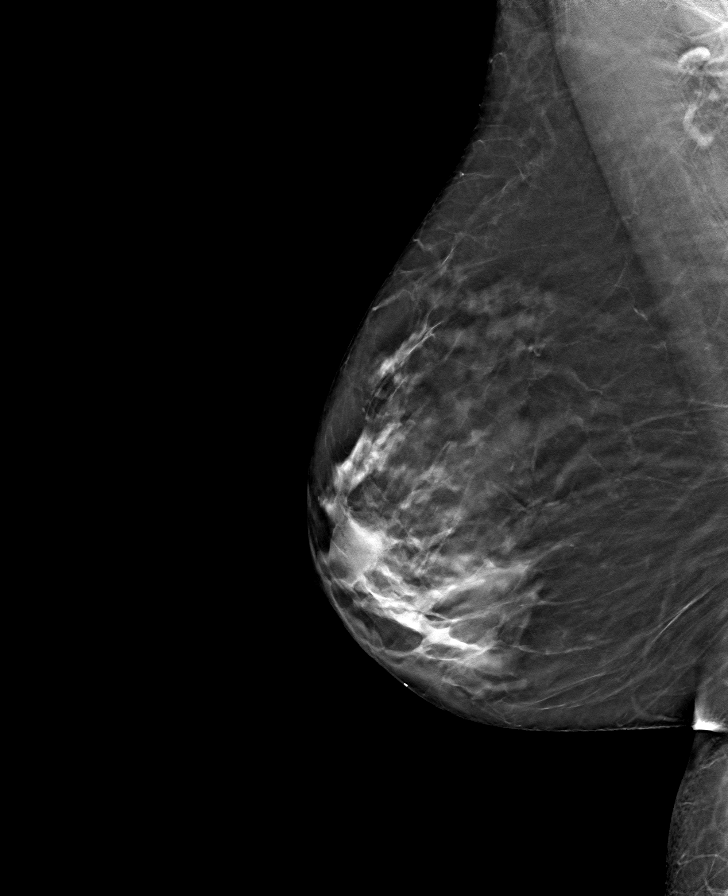

[8 of 24 positions shown; findings below may reference images not displayed]

ACR Breast Density Category c: The breast tissue is heterogeneously
dense, which may obscure small masses.
FINDINGS: There are no findings suspicious for malignancy. The images were
evaluated with computer-aided detection.
IMPRESSION: No mammographic evidence of malignancy. A result letter of this
screening mammogram will be mailed directly to the patient.

RECOMMENDATION:
Screening mammogram in one year. (Code:T4-5-GWO)

BI-RADS CATEGORY  1: Negative.

## 2022-09-27 DIAGNOSIS — S81812A Laceration without foreign body, left lower leg, initial encounter: Secondary | ICD-10-CM | POA: Diagnosis not present

## 2022-09-27 DIAGNOSIS — Z9181 History of falling: Secondary | ICD-10-CM | POA: Diagnosis not present

## 2022-09-27 DIAGNOSIS — R634 Abnormal weight loss: Secondary | ICD-10-CM | POA: Diagnosis not present

## 2022-09-27 DIAGNOSIS — G2581 Restless legs syndrome: Secondary | ICD-10-CM | POA: Diagnosis not present

## 2022-10-11 DIAGNOSIS — M25511 Pain in right shoulder: Secondary | ICD-10-CM | POA: Diagnosis not present

## 2022-10-18 DIAGNOSIS — M25511 Pain in right shoulder: Secondary | ICD-10-CM | POA: Diagnosis not present

## 2022-10-20 ENCOUNTER — Ambulatory Visit
Admission: RE | Admit: 2022-10-20 | Discharge: 2022-10-20 | Disposition: A | Discharge status: Home / Self Care | Payer: 59 | Source: Ambulatory Visit | Attending: Family Medicine | Admitting: Family Medicine

## 2022-10-20 DIAGNOSIS — Z1231 Encounter for screening mammogram for malignant neoplasm of breast: Secondary | ICD-10-CM | POA: Diagnosis not present

## 2022-11-01 DIAGNOSIS — M25511 Pain in right shoulder: Secondary | ICD-10-CM | POA: Diagnosis not present

## 2022-11-16 DIAGNOSIS — M25511 Pain in right shoulder: Secondary | ICD-10-CM | POA: Diagnosis not present

## 2022-11-17 DIAGNOSIS — G2581 Restless legs syndrome: Secondary | ICD-10-CM | POA: Diagnosis not present

## 2022-11-17 DIAGNOSIS — G8929 Other chronic pain: Secondary | ICD-10-CM | POA: Diagnosis not present

## 2022-11-17 DIAGNOSIS — M25511 Pain in right shoulder: Secondary | ICD-10-CM | POA: Diagnosis not present

## 2022-11-22 DIAGNOSIS — M7541 Impingement syndrome of right shoulder: Secondary | ICD-10-CM | POA: Diagnosis not present

## 2022-11-29 DIAGNOSIS — M7541 Impingement syndrome of right shoulder: Secondary | ICD-10-CM | POA: Diagnosis not present

## 2022-12-06 DIAGNOSIS — M7541 Impingement syndrome of right shoulder: Secondary | ICD-10-CM | POA: Diagnosis not present

## 2023-09-13 DIAGNOSIS — Z1211 Encounter for screening for malignant neoplasm of colon: Secondary | ICD-10-CM | POA: Diagnosis not present

## 2023-09-13 DIAGNOSIS — K269 Duodenal ulcer, unspecified as acute or chronic, without hemorrhage or perforation: Secondary | ICD-10-CM | POA: Diagnosis not present

## 2023-09-13 DIAGNOSIS — R112 Nausea with vomiting, unspecified: Secondary | ICD-10-CM | POA: Diagnosis not present

## 2023-11-09 DIAGNOSIS — K269 Duodenal ulcer, unspecified as acute or chronic, without hemorrhage or perforation: Secondary | ICD-10-CM | POA: Diagnosis not present

## 2023-12-04 ENCOUNTER — Other Ambulatory Visit: Payer: Self-pay | Admitting: Family Medicine

## 2023-12-04 DIAGNOSIS — Z1231 Encounter for screening mammogram for malignant neoplasm of breast: Secondary | ICD-10-CM

## 2023-12-14 ENCOUNTER — Ambulatory Visit

## 2023-12-14 ENCOUNTER — Ambulatory Visit
Admission: RE | Admit: 2023-12-14 | Discharge: 2023-12-14 | Disposition: A | Discharge status: Home / Self Care | Source: Ambulatory Visit | Attending: Family Medicine | Admitting: Family Medicine

## 2023-12-14 DIAGNOSIS — Z1231 Encounter for screening mammogram for malignant neoplasm of breast: Secondary | ICD-10-CM | POA: Diagnosis not present
